# Patient Record
Sex: Female | Born: 1965 | Race: White | Hispanic: No | Smoking: Former smoker
Health system: Southern US, Community
[De-identification: ages and names within clinical notes are randomized; demographics above are authoritative.]

## PROBLEM LIST (undated history)

## (undated) DIAGNOSIS — T7840XA Allergy, unspecified, initial encounter: Secondary | ICD-10-CM

## (undated) DIAGNOSIS — D649 Anemia, unspecified: Secondary | ICD-10-CM

## (undated) DIAGNOSIS — E039 Hypothyroidism, unspecified: Secondary | ICD-10-CM

## (undated) DIAGNOSIS — E119 Type 2 diabetes mellitus without complications: Secondary | ICD-10-CM

## (undated) DIAGNOSIS — I10 Essential (primary) hypertension: Secondary | ICD-10-CM

## (undated) DIAGNOSIS — E786 Lipoprotein deficiency: Secondary | ICD-10-CM

## (undated) DIAGNOSIS — E669 Obesity, unspecified: Secondary | ICD-10-CM

## (undated) DIAGNOSIS — E66811 Obesity, class 1: Secondary | ICD-10-CM

## (undated) DIAGNOSIS — E079 Disorder of thyroid, unspecified: Secondary | ICD-10-CM

## (undated) HISTORY — DX: Obesity, unspecified: E66.9

## (undated) HISTORY — DX: Lipoprotein deficiency: E78.6

## (undated) HISTORY — PX: OTHER SURGICAL HISTORY: SHX169

## (undated) HISTORY — PX: CHOLECYSTECTOMY: SHX55

## (undated) HISTORY — DX: Disorder of thyroid, unspecified: E07.9

## (undated) HISTORY — DX: Obesity, class 1: E66.811

## (undated) HISTORY — PX: TUBAL LIGATION: SHX77

## (undated) HISTORY — DX: Allergy, unspecified, initial encounter: T78.40XA

## (undated) HISTORY — DX: Anemia, unspecified: D64.9

## (undated) HISTORY — DX: Essential (primary) hypertension: I10

## (undated) HISTORY — PX: TONSILLECTOMY: SUR1361

---

## 2010-11-17 ENCOUNTER — Encounter: Payer: Self-pay | Admitting: Obstetrics & Gynecology

## 2010-12-09 ENCOUNTER — Encounter: Payer: Self-pay | Admitting: Obstetrics & Gynecology

## 2010-12-16 ENCOUNTER — Other Ambulatory Visit: Payer: Self-pay | Admitting: Obstetrics & Gynecology

## 2010-12-16 ENCOUNTER — Encounter (INDEPENDENT_AMBULATORY_CARE_PROVIDER_SITE_OTHER): Payer: BC Managed Care – PPO | Admitting: Obstetrics & Gynecology

## 2010-12-16 DIAGNOSIS — Z01419 Encounter for gynecological examination (general) (routine) without abnormal findings: Secondary | ICD-10-CM

## 2010-12-16 DIAGNOSIS — Z1231 Encounter for screening mammogram for malignant neoplasm of breast: Secondary | ICD-10-CM

## 2010-12-16 DIAGNOSIS — N949 Unspecified condition associated with female genital organs and menstrual cycle: Secondary | ICD-10-CM

## 2010-12-16 DIAGNOSIS — Z113 Encounter for screening for infections with a predominantly sexual mode of transmission: Secondary | ICD-10-CM

## 2010-12-16 DIAGNOSIS — Z1272 Encounter for screening for malignant neoplasm of vagina: Secondary | ICD-10-CM

## 2010-12-16 DIAGNOSIS — N938 Other specified abnormal uterine and vaginal bleeding: Secondary | ICD-10-CM

## 2010-12-17 NOTE — Assessment & Plan Note (Signed)
NAME:  Alexa Murphy, Alexa Murphy                 ACCOUNT NO.:  [id number]  MEDICAL RECORD NO.:  [id number]           PATIENT TYPE:  LOCATION:  [facility]           FACILITY:  PHYSICIAN:  Allie Bossier, MD             DATE OF BIRTH:  DATE OF SERVICE:  12/16/2010                                 CLINIC NOTE  HISTORY OF PRESENT ILLNESS:  Alexa Murphy is a 45 year old divorced white G4, P2, A2 who comes in here for annual exam.  Her complaint is that of a 4-week history of right lower quadrant pain.  She also states that her periods have become irregular.  Her other issue is that of unprotected intercourse with two people, and she would like to be tested "for STDs."  PAST MEDICAL HISTORY:  Obesity and seasonal allergies.  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  She has been divorced 3 times, is currently sexually active with 2 friends "with benefits."  She has lost 25 pounds over the last 2 years intentionally.  She works at the night shift.  She has recently purchased a new house.  Periods are generally monthly and last 5-7 days but this past month she had an occasion of intermenstrual bleeding.  Her Pap smear, mammogram, and colonoscopy were all done in 2010.  She in 1999 was diagnosed with syphilis and at 45 years of age gonorrhea and in approximately 2003 Chlamydia and at some point she had some trichomoniasis.  PAST SURGICAL HISTORY:  C-section and tubal ligation.  FAMILY HISTORY:  No colon or breast cancer, but uterine cancer in her mother and her maternal grandmother.  SOCIAL HISTORY:  She drinks socially but denies tobacco or drug use.  ALLERGIES:  No latex allergies.  No known drug allergies.  MEDICATIONS:  Zyrtec p.r.n. and a multivitamin daily.  PHYSICAL EXAMINATION:  GENERAL/VITAL SIGNS:  Well-nourished, well- hydrated very pleasant white female, height 5 feet 6 inches, weight 196 pounds, blood pressure 120/66, and pulse 83. HEENT:   Normal. HEART:  Regular rhythm. LUNGS:  Clear to auscultation bilaterally.  Breast exam normal bilaterally. ABDOMEN:  Obese, benign.  No palpable hepatosplenomegaly.  Well-healed vertical C-section scar. EXTERNAL GENITALIA:  Shaved.  No lesions.  Vagina:  No lesions.  Normal discharge.  Cervix:  Parous, no lesions.  Uterus:  10-week size, lumpy consistent with fibroids, it is mobile.  She has a generous pelvis, right adnexa (where she has a pain), nontender and no masses.  The left adnexa does appear to have about a 4 cm mass but this could be related to a pedunculated fibroid as well.  ASSESSMENT AND PLAN.: 1. Annual exam.  I have checked Pap smear and recommended self-breast     and self-vulvar exams. 2. High-risk sexual behavior.  I have definitely recommended she use     condoms for prevention of STDs and I am checking her for GC,     Chlamydia, syphilis, hep C, hep B, HIV (she declines HSV-2 IgG). 3. Right lower quadrant pain and fibroid uterus on exam and question     of left adnexal mass.  I am  getting a GYN ultrasound scheduled, and     she will come back when those results are available this.  For general health maintenance, I am checking fasting lipids and TSH today, and fasting sugar.     Allie Bossier, MD    MCD/MEDQ  D:  12/16/2010  T:  12/17/2010  Job:  [id number]

## 2010-12-21 ENCOUNTER — Ambulatory Visit
Admission: RE | Admit: 2010-12-21 | Discharge: 2010-12-21 | Disposition: A | Discharge status: Home / Self Care | Payer: BC Managed Care – PPO | Source: Ambulatory Visit | Attending: Obstetrics & Gynecology | Admitting: Obstetrics & Gynecology

## 2010-12-21 ENCOUNTER — Ambulatory Visit
Admission: RE | Admit: 2010-12-21 | Discharge: 2010-12-21 | Disposition: A | Discharge status: Home / Self Care | Payer: BC Managed Care – PPO | Source: Ambulatory Visit

## 2010-12-21 DIAGNOSIS — N938 Other specified abnormal uterine and vaginal bleeding: Secondary | ICD-10-CM

## 2010-12-22 ENCOUNTER — Ambulatory Visit
Admission: RE | Admit: 2010-12-22 | Discharge: 2010-12-22 | Disposition: A | Discharge status: Home / Self Care | Payer: BC Managed Care – PPO | Source: Ambulatory Visit | Attending: Obstetrics & Gynecology | Admitting: Obstetrics & Gynecology

## 2010-12-22 DIAGNOSIS — Z1231 Encounter for screening mammogram for malignant neoplasm of breast: Secondary | ICD-10-CM

## 2010-12-23 ENCOUNTER — Ambulatory Visit: Payer: BC Managed Care – PPO

## 2010-12-23 ENCOUNTER — Other Ambulatory Visit: Payer: BC Managed Care – PPO

## 2010-12-30 ENCOUNTER — Ambulatory Visit: Payer: BC Managed Care – PPO | Admitting: Obstetrics & Gynecology

## 2010-12-30 DIAGNOSIS — R1031 Right lower quadrant pain: Secondary | ICD-10-CM

## 2010-12-30 DIAGNOSIS — N938 Other specified abnormal uterine and vaginal bleeding: Secondary | ICD-10-CM

## 2010-12-30 DIAGNOSIS — N949 Unspecified condition associated with female genital organs and menstrual cycle: Secondary | ICD-10-CM

## 2011-03-25 NOTE — Assessment & Plan Note (Signed)
NAME:  Alexa Murphy, Alexa Murphy                 ACCOUNT NO.:  [id number]  MEDICAL RECORD NO.:  [id number]           PATIENT TYPE:  LOCATION:  [facility] [place]           FACILITY:  PHYSICIAN:  Allie Bossier, MD             DATE OF BIRTH:  DATE OF SERVICE:  12/30/2010                                 CLINIC NOTE  Ms. Molinaro is a 45 year old divorced white G4, P2, A2 who was seen here for an annual exam on December 16, 2010.  At that time, she gave me a history of 4 weeks of right lower quadrant pain associated also with some irregular periods.  I did a full STD workup, these tests were all negative.  I did a Pap smear which was normal and I did a GYN ultrasound.  The uterus measured 12.9 cm with a posterior uterine body intramural fibroid measuring less than 2 cm and another one measuring less than 2 cm in the anterior uterine body.  I have explained that it is very unlikely that these 2 small fibroids were noted to be culprit for her pain.  I have recommended that she discontinue her Motrin, which she says does relieve the pain.  I also told her that to regulate her periods, I would recommend birth control pills (she is a nonsmoker). She agrees with this.  I have given her sample and a prescription for generic Ortho-Tri-Cyclen Lo.  She will start it today.  She will come back in a week for a blood pressure check.  I have told her that should her pain become worse or not get better over time, we would offer her a laparoscopy.     Allie Bossier, MD    MCD/MEDQ  D:  12/30/2010  T:  12/30/2010  Job:  [id number]

## 2012-03-06 ENCOUNTER — Ambulatory Visit (INDEPENDENT_AMBULATORY_CARE_PROVIDER_SITE_OTHER): Payer: BC Managed Care – PPO | Admitting: Physician Assistant

## 2012-03-06 ENCOUNTER — Other Ambulatory Visit: Payer: Self-pay | Admitting: Physician Assistant

## 2012-03-06 ENCOUNTER — Encounter: Payer: Self-pay | Admitting: Physician Assistant

## 2012-03-06 VITALS — BP 123/80 | HR 98 | Temp 99.1°F | Ht 66.0 in | Wt 187.0 lb

## 2012-03-06 DIAGNOSIS — E786 Lipoprotein deficiency: Secondary | ICD-10-CM

## 2012-03-06 DIAGNOSIS — J029 Acute pharyngitis, unspecified: Secondary | ICD-10-CM

## 2012-03-06 DIAGNOSIS — Z7251 High risk heterosexual behavior: Secondary | ICD-10-CM

## 2012-03-06 DIAGNOSIS — D649 Anemia, unspecified: Secondary | ICD-10-CM

## 2012-03-06 LAB — POCT URINALYSIS DIPSTICK
Bilirubin, UA: NEGATIVE
Blood, UA: NEGATIVE
Glucose, UA: NEGATIVE
Ketones, UA: NEGATIVE
Leukocytes, UA: NEGATIVE
Nitrite, UA: NEGATIVE

## 2012-03-06 LAB — POCT RAPID STREP A (OFFICE): Rapid Strep A Screen: NEGATIVE

## 2012-03-06 NOTE — Progress Notes (Signed)
  Subjective:    Patient ID: Alexa Murphy, female    DOB: 03-21-1966, 46 y.o.   MRN: [id number]  HPI Patient presents to the clinic as a new patient to establish care. PMH reviewed. Pt is on zyrtec for allergies and iron for anemia. She recently had blood work drawn at previous doctor and was told LDL was good but HDL was low and she was anemia.   She comes in today with sore throat that start yesterday.  She has not treated with anything. She still continues to eat and has no GI symptoms. She has ran a low grade fever for 2 days. She denies ear pain, SOB, cough, chills. She has had a mild headache. NO one around her has been sick. She was very busy this weekend. She went to carowinds and per pt had a lot of sex. She is also here to be tested for STD's. She has two sexual partners is not aware if how many partners they have. She did have a UTI a month ago. She denies painful urination today but she does state that she hurts vaginal. No abdomnial pain. Per patient she said that might be from the "rough sex" she had this weekend. She has had some clear vaginal discharge but nothing out of the ordinary for her.   Review of Systems     Objective:   Physical Exam  Constitutional: She is oriented to person, place, and time. She appears well-developed and well-nourished.  HENT:  Head: Normocephalic and atraumatic.  Right Ear: External ear normal.  Left Ear: External ear normal.  Nose: Nose normal.       TM's normal bilaterally. Negative to maxillary tenderness with palpation.  Oropharynx erythematous and 1-2 patches of exudate on pharynx right side/tonsils absent.  Eyes: Conjunctivae are normal.  Neck: Normal range of motion. Neck supple.       Bilateral anterior cervical enlarged lymhnodes and slightly tender to palpation.  Cardiovascular: Normal rate, regular rhythm, normal heart sounds and intact distal pulses.   Pulmonary/Chest: Effort normal and breath sounds normal. She has no wheezes.    Lymphadenopathy:    She has cervical adenopathy.  Neurological: She is alert and oriented to person, place, and time.  Skin: Skin is warm and dry.  Psychiatric: She has a normal mood and affect. Her behavior is normal.          Assessment & Plan:  Sore throat- Rapid strep negative. Will send throat culture off and call with results. Likely viral. Will treat symptomatically and gave pt handout. Mucinex-D twice a day. Drink lots of water. Tylenol help with sore throat. Gargling with salt water. If not improving in 48 hours call office.   High-risk sexual behavior- Will test for HIV, Sphyills, Hepatitis, GC and Chlamydia. UA-negative for blood, leuks, or nitrates. Needs pap smear yearly and due for now since high-risk behavior.   Low HDL- will recheck in 3-6 months with other blood work to make sure no other labs are abnormal.   Anemia- Will recheck in 3 months a CBC/iron panel to see if hgb is back up along with stores.

## 2012-03-06 NOTE — Patient Instructions (Addendum)
Mucinex-D twice a day. Drink lots of water. Tylenol help with sore throat. Gargling with salt water. If not improving in 48 hours call office.  Follow up in 3 months on anemia.  Will call with throat culture results.   Viral Pharyngitis Viral pharyngitis is a viral infection that produces redness, pain, and swelling (inflammation) of the throat. It can spread from person to person (contagious). CAUSES Viral pharyngitis is caused by inhaling a large amount of certain germs called viruses. Many different viruses cause viral pharyngitis. SYMPTOMS Symptoms of viral pharyngitis include:  Sore throat.   Tiredness.   Stuffy nose.   Low-grade fever.   Congestion.   Cough.  TREATMENT Treatment includes rest, drinking plenty of fluids, and the use of over-the-counter medication (approved by your caregiver). HOME CARE INSTRUCTIONS   Drink enough fluids to keep your urine clear or pale yellow.   Eat soft, cold foods such as ice cream, frozen ice pops, or gelatin dessert.   Gargle with warm salt water (1 tsp salt per 1 qt of water).   If over age 53, throat lozenges may be used safely.   Only take over-the-counter or prescription medicines for pain, discomfort, or fever as directed by your caregiver. Do not take aspirin.  To help prevent spreading viral pharyngitis to others, avoid:  Mouth-to-mouth contact with others.   Sharing utensils for eating and drinking.   Coughing around others.  SEEK MEDICAL CARE IF:   You are better in a few days, then become worse.   You have a fever or pain not helped by pain medicines.   There are any other changes that concern you.  Document Released: 06/09/2005 Document Revised: 08/19/2011 Document Reviewed: 11/05/2010 [facility] Patient Information 2012 [place], [place].

## 2012-03-07 LAB — RPR

## 2012-03-07 LAB — GC/CHLAMYDIA PROBE AMP, URINE
Chlamydia, Swab/Urine, PCR: NEGATIVE
GC Probe Amp, Urine: NEGATIVE

## 2012-03-07 LAB — HEPATITIS PANEL, ACUTE: Hepatitis B Surface Ag: NEGATIVE

## 2012-03-07 LAB — HIV ANTIBODY (ROUTINE TESTING W REFLEX): HIV: NONREACTIVE

## 2012-03-08 ENCOUNTER — Encounter: Payer: Self-pay | Admitting: Obstetrics & Gynecology

## 2012-03-08 ENCOUNTER — Encounter: Payer: Self-pay | Admitting: Physician Assistant

## 2012-03-08 ENCOUNTER — Ambulatory Visit (INDEPENDENT_AMBULATORY_CARE_PROVIDER_SITE_OTHER): Payer: BC Managed Care – PPO | Admitting: Obstetrics & Gynecology

## 2012-03-08 ENCOUNTER — Telehealth: Payer: Self-pay | Admitting: *Deleted

## 2012-03-08 VITALS — BP 116/74 | HR 87 | Temp 98.4°F | Resp 16 | Ht 65.0 in | Wt 188.0 lb

## 2012-03-08 DIAGNOSIS — Z1151 Encounter for screening for human papillomavirus (HPV): Secondary | ICD-10-CM

## 2012-03-08 DIAGNOSIS — E786 Lipoprotein deficiency: Secondary | ICD-10-CM | POA: Insufficient documentation

## 2012-03-08 DIAGNOSIS — Z124 Encounter for screening for malignant neoplasm of cervix: Secondary | ICD-10-CM

## 2012-03-08 DIAGNOSIS — J302 Other seasonal allergic rhinitis: Secondary | ICD-10-CM | POA: Insufficient documentation

## 2012-03-08 DIAGNOSIS — Z0000 Encounter for general adult medical examination without abnormal findings: Secondary | ICD-10-CM

## 2012-03-08 DIAGNOSIS — D649 Anemia, unspecified: Secondary | ICD-10-CM | POA: Insufficient documentation

## 2012-03-08 NOTE — Progress Notes (Signed)
Subjective:    Alexa Murphy is a 46 y.o. female who presents for an annual exam. The patient has no complaints today. Her pelvic pain from the spring resolvedThe patient is sexually active. GYN screening history: last pap: was normal. The patient wears seatbelts: yes. The patient participates in regular exercise: yes. Has the patient ever been transfused or tattooed?: yes. The patient reports that there is not domestic violence in her life.   Menstrual History: OB History    Grav Para Term Preterm Abortions TAB SAB Ect Mult Living                  Menarche age: 12 Patient's last menstrual period was 02/19/2012.    The following portions of the patient's history were reviewed and updated as appropriate: allergies, current medications, past family history, past medical history, past social history, past surgical history and problem list.  Review of Systems A comprehensive review of systems was negative. She lives alone. Her periods are normal. She recently had STI testing. She wants a thyroid panel drawn as she has an appt with a "thyroid doctor" in the near future.   Objective:    BP 116/74  Pulse 87  Temp 98.4 F (36.9 C) (Oral)  Resp 16  Ht 5\' 5"  (1.651 m)  Wt 188 lb (85.276 kg)  BMI 31.28 kg/m2  LMP 02/19/2012  General Appearance:    Alert, cooperative, no distress, appears stated age  Head:    Normocephalic, without obvious abnormality, atraumatic  Eyes:    PERRL, conjunctiva/corneas clear, EOM's intact, fundi    benign, both eyes  Ears:    Normal TM's and external ear canals, both ears  Nose:   Nares normal, septum midline, mucosa normal, no drainage    or sinus tenderness  Throat:   Lips, mucosa, and tongue normal; teeth and gums normal  Neck:   Supple, symmetrical, trachea midline, no adenopathy;    thyroid:  no enlargement/tenderness/nodules; no carotid   bruit or JVD  Back:     Symmetric, no curvature, ROM normal, no CVA tenderness  Lungs:     Clear to auscultation  bilaterally, respirations unlabored  Chest Wall:    No tenderness or deformity   Heart:    Regular rate and rhythm, S1 and S2 normal, no murmur, rub   or gallop  Breast Exam:    No tenderness, masses, or nipple abnormality  Abdomen:     Soft, non-tender, bowel sounds active all four quadrants,    no masses, no organomegaly  Genitalia:    Normal female without lesion, discharge or tenderness, shaved, no lesions, normal cervix, 10 week size mobile, NT uterus, no adnexal masses     Extremities:   Extremities normal, atraumatic, no cyanosis or edema  Pulses:   2+ and symmetric all extremities  Skin:   Skin color, texture, turgor normal, no rashes or lesions  Lymph nodes:   Cervical, supraclavicular, and axillary nodes normal  Neurologic:   CNII-XII intact, normal strength, sensation and reflexes    throughout  .    Assessment:    Healthy female exam.    Plan:     Mammogram. Pap smear.

## 2012-03-08 NOTE — Telephone Encounter (Signed)
Pt states that she is not feeling any better and states that now it hurts to swallow. States you told her to call today if not better. Please advise.

## 2012-03-08 NOTE — Telephone Encounter (Signed)
Let's wait until throat culture comes back then decide what next step is.

## 2012-03-09 LAB — CULTURE, GROUP A STREP

## 2012-03-09 LAB — T4, FREE: Free T4: 1.1 ng/dL (ref 0.80–1.80)

## 2012-03-09 LAB — T3, FREE: T3, Free: 3 pg/mL (ref 2.3–4.2)

## 2012-03-09 NOTE — Telephone Encounter (Signed)
Pt informed

## 2012-03-23 ENCOUNTER — Other Ambulatory Visit: Payer: Self-pay | Admitting: Obstetrics & Gynecology

## 2012-03-23 DIAGNOSIS — Z139 Encounter for screening, unspecified: Secondary | ICD-10-CM

## 2012-03-28 ENCOUNTER — Ambulatory Visit (INDEPENDENT_AMBULATORY_CARE_PROVIDER_SITE_OTHER): Payer: BC Managed Care – PPO

## 2012-03-28 DIAGNOSIS — Z0000 Encounter for general adult medical examination without abnormal findings: Secondary | ICD-10-CM

## 2012-03-28 DIAGNOSIS — Z1231 Encounter for screening mammogram for malignant neoplasm of breast: Secondary | ICD-10-CM

## 2013-05-28 ENCOUNTER — Other Ambulatory Visit: Payer: Self-pay | Admitting: Internal Medicine

## 2013-05-28 DIAGNOSIS — Z1231 Encounter for screening mammogram for malignant neoplasm of breast: Secondary | ICD-10-CM

## 2013-06-05 ENCOUNTER — Ambulatory Visit (INDEPENDENT_AMBULATORY_CARE_PROVIDER_SITE_OTHER): Payer: BC Managed Care – PPO

## 2013-06-05 ENCOUNTER — Ambulatory Visit: Payer: BC Managed Care – PPO

## 2013-06-05 DIAGNOSIS — R928 Other abnormal and inconclusive findings on diagnostic imaging of breast: Secondary | ICD-10-CM

## 2013-06-05 DIAGNOSIS — Z1231 Encounter for screening mammogram for malignant neoplasm of breast: Secondary | ICD-10-CM

## 2013-06-07 ENCOUNTER — Other Ambulatory Visit: Payer: Self-pay | Admitting: Internal Medicine

## 2013-06-07 DIAGNOSIS — R928 Other abnormal and inconclusive findings on diagnostic imaging of breast: Secondary | ICD-10-CM

## 2013-06-22 ENCOUNTER — Ambulatory Visit
Admission: RE | Admit: 2013-06-22 | Discharge: 2013-06-22 | Disposition: A | Discharge status: Home / Self Care | Payer: BC Managed Care – PPO | Source: Ambulatory Visit | Attending: Internal Medicine | Admitting: Internal Medicine

## 2013-06-22 DIAGNOSIS — R928 Other abnormal and inconclusive findings on diagnostic imaging of breast: Secondary | ICD-10-CM

## 2014-01-24 ENCOUNTER — Ambulatory Visit (INDEPENDENT_AMBULATORY_CARE_PROVIDER_SITE_OTHER): Payer: BC Managed Care – PPO | Admitting: Obstetrics & Gynecology

## 2014-01-24 ENCOUNTER — Encounter: Payer: Self-pay | Admitting: Obstetrics & Gynecology

## 2014-01-24 VITALS — BP 127/78 | HR 73 | Resp 16 | Ht 66.0 in | Wt 201.0 lb

## 2014-01-24 DIAGNOSIS — Z124 Encounter for screening for malignant neoplasm of cervix: Secondary | ICD-10-CM

## 2014-01-24 DIAGNOSIS — Z0000 Encounter for general adult medical examination without abnormal findings: Secondary | ICD-10-CM

## 2014-01-24 DIAGNOSIS — Z01419 Encounter for gynecological examination (general) (routine) without abnormal findings: Secondary | ICD-10-CM

## 2014-01-24 DIAGNOSIS — N920 Excessive and frequent menstruation with regular cycle: Secondary | ICD-10-CM

## 2014-01-24 DIAGNOSIS — Z1151 Encounter for screening for human papillomavirus (HPV): Secondary | ICD-10-CM

## 2014-01-24 NOTE — Progress Notes (Signed)
Subjective:    Alexa Murphy is a 48 y.o. female who presents for an annual exam. She says that her periods are very heavy and she is interested in the endometrial ablation. They are monthly, bleeds for 5 days, some cramping and fatigue. She was told that her hbg was 9 about 2 months ago by her endocrinologist and she was started on iron pills. She has to wear a pad and a tampon at the same time.The patient is sexually active. GYN screening history: last pap: was normal. The patient wears seatbelts: yes. The patient participates in regular exercise: yes. Has the patient ever been transfused or tattooed?: yes. The patient reports that there is not domestic violence in her life.   Menstrual History: OB History   Grav Para Term Preterm Abortions TAB SAB Ect Mult Living   4 3   1  1   2       Menarche age: 68  Patient's last menstrual period was 01/03/2014.    The following portions of the patient's history were reviewed and updated as appropriate: allergies, current medications, past family history, past medical history, past social history, past surgical history and problem list.  Review of Systems A comprehensive review of systems was negative. Her mammogram was done recently and was reportedly normal. She gets her TSH checked about every 3 months.   Objective:    BP 127/78  Pulse 73  Resp 16  Ht 5\' 6"  (1.676 m)  Wt 201 lb (91.173 kg)  BMI 32.46 kg/m2  LMP 01/03/2014  General Appearance:    Alert, cooperative, no distress, appears stated age  Head:    Normocephalic, without obvious abnormality, atraumatic  Eyes:    PERRL, conjunctiva/corneas clear, EOM's intact, fundi    benign, both eyes  Ears:    Normal TM's and external ear canals, both ears  Nose:   Nares normal, septum midline, mucosa normal, no drainage    or sinus tenderness  Throat:   Lips, mucosa, and tongue normal; teeth and gums normal  Neck:   Supple, symmetrical, trachea midline, no adenopathy;    thyroid:  no  enlargement/tenderness/nodules; no carotid   bruit or JVD  Back:     Symmetric, no curvature, ROM normal, no CVA tenderness  Lungs:     Clear to auscultation bilaterally, respirations unlabored  Chest Wall:    No tenderness or deformity   Heart:    Regular rate and rhythm, S1 and S2 normal, no murmur, rub   or gallop  Breast Exam:    No tenderness, masses, or nipple abnormality  Abdomen:     Soft, non-tender, bowel sounds active all four quadrants,    no masses, no organomegaly  Genitalia:    Normal female without lesion, discharge or tenderness, shaved, 10-12 week size, mobile, NT, normal adnexal exam     Extremities:   Extremities normal, atraumatic, no cyanosis or edema  Pulses:   2+ and symmetric all extremities  Skin:   Skin color, texture, turgor normal, no rashes or lesions  Lymph nodes:   Cervical, supraclavicular, and axillary nodes normal  Neurologic:   CNII-XII intact, normal strength, sensation and reflexes    throughout  .    Assessment:    Healthy female exam.  Menorrhagia   Plan:     Breast self exam technique reviewed and patient encouraged to perform self-exam monthly. Pelvic ultrasound. Thin prep Pap smear. with cotesting Cbc and TSH and gyn u/s

## 2014-01-25 ENCOUNTER — Ambulatory Visit (INDEPENDENT_AMBULATORY_CARE_PROVIDER_SITE_OTHER): Payer: BC Managed Care – PPO

## 2014-01-25 ENCOUNTER — Encounter: Payer: Self-pay | Admitting: *Deleted

## 2014-01-25 DIAGNOSIS — D252 Subserosal leiomyoma of uterus: Secondary | ICD-10-CM

## 2014-01-25 DIAGNOSIS — N920 Excessive and frequent menstruation with regular cycle: Secondary | ICD-10-CM

## 2014-01-25 LAB — CBC
HCT: 39.9 % (ref 36.0–46.0)
HEMOGLOBIN: 13.2 g/dL (ref 12.0–15.0)
MCH: 28.2 pg (ref 26.0–34.0)
MCHC: 33.1 g/dL (ref 30.0–36.0)
MCV: 85.3 fL (ref 78.0–100.0)
Platelets: 268 10*3/uL (ref 150–400)
RBC: 4.68 MIL/uL (ref 3.87–5.11)
RDW: 21 % — AB (ref 11.5–15.5)
WBC: 7.8 10*3/uL (ref 4.0–10.5)

## 2014-01-25 LAB — TSH: TSH: 2.35 u[IU]/mL (ref 0.350–4.500)

## 2014-01-25 NOTE — Telephone Encounter (Signed)
Erroneous encounter

## 2014-02-08 ENCOUNTER — Encounter: Payer: Self-pay | Admitting: Obstetrics & Gynecology

## 2014-02-08 ENCOUNTER — Ambulatory Visit (INDEPENDENT_AMBULATORY_CARE_PROVIDER_SITE_OTHER): Payer: BC Managed Care – PPO | Admitting: Obstetrics & Gynecology

## 2014-02-08 DIAGNOSIS — N920 Excessive and frequent menstruation with regular cycle: Secondary | ICD-10-CM

## 2014-02-08 MED ORDER — MISOPROSTOL 200 MCG PO TABS: ORAL_TABLET | ORAL | Status: DC

## 2014-02-08 NOTE — Progress Notes (Signed)
   Subjective:    Patient ID: Alexa Murphy, female    DOB: [birth year], 48 y.o.   MRN: [id number]  HPI  She is here to follow up on her labs and u/s. She is still interested in having the endometrial ablation. She had a BTL in the past.  Review of Systems     Objective:   Physical Exam        Assessment & Plan:  Menorrhagia with negative w/u (2 small fibroids)- plan for d&c and Novasure endometrial ablation.

## 2014-03-15 ENCOUNTER — Encounter (HOSPITAL_COMMUNITY): Payer: Self-pay | Admitting: Pharmacist

## 2014-03-21 ENCOUNTER — Encounter (HOSPITAL_COMMUNITY): Payer: Self-pay

## 2014-03-21 ENCOUNTER — Encounter (HOSPITAL_COMMUNITY)
Admission: RE | Admit: 2014-03-21 | Discharge: 2014-03-21 | Disposition: A | Discharge status: Home / Self Care | Payer: BC Managed Care – PPO | Source: Ambulatory Visit | Attending: Obstetrics & Gynecology | Admitting: Obstetrics & Gynecology

## 2014-03-21 DIAGNOSIS — Z01812 Encounter for preprocedural laboratory examination: Secondary | ICD-10-CM | POA: Insufficient documentation

## 2014-03-21 HISTORY — DX: Hypothyroidism, unspecified: E03.9

## 2014-03-21 HISTORY — DX: Type 2 diabetes mellitus without complications: E11.9

## 2014-03-21 LAB — BASIC METABOLIC PANEL
Anion gap: 9 (ref 5–15)
BUN: 11 mg/dL (ref 6–23)
CHLORIDE: 105 meq/L (ref 96–112)
CO2: 26 mEq/L (ref 19–32)
Calcium: 9.3 mg/dL (ref 8.4–10.5)
Creatinine, Ser: 0.94 mg/dL (ref 0.50–1.10)
GFR calc Af Amer: 81 mL/min — ABNORMAL LOW (ref >90)
GFR, EST NON AFRICAN AMERICAN: 70 mL/min — AB (ref >90)
Glucose, Bld: 93 mg/dL (ref 70–99)
Potassium: 4.4 mEq/L (ref 3.7–5.3)
Sodium: 140 mEq/L (ref 137–147)

## 2014-03-21 LAB — CBC
HCT: 39.8 % (ref 36.0–46.0)
Hemoglobin: 13.2 g/dL (ref 12.0–15.0)
MCH: 29.6 pg (ref 26.0–34.0)
MCHC: 33.2 g/dL (ref 30.0–36.0)
MCV: 89.2 fL (ref 78.0–100.0)
Platelets: 225 10*3/uL (ref 150–400)
RBC: 4.46 MIL/uL (ref 3.87–5.11)
RDW: 13.9 % (ref 11.5–15.5)
WBC: 6.2 10*3/uL (ref 4.0–10.5)

## 2014-03-21 NOTE — Patient Instructions (Signed)
[address]  03/21/2014   Your procedure is scheduled on:  03/27/14  Enter through the Main Entrance of [facility] at [place] up the phone at the desk and dial [phone].   Call this number if you have problems the morning of surgery: [phone]   Remember:   Do not eat food:After Midnight.  Do not drink clear liquids: After Midnight.  Take these medicines the morning of surgery with A SIP OF WATER: NA.  Hold Metformin 24hrs prior to surgery.   Do not wear jewelry, make-up or nail polish.  Do not wear lotions, powders, or perfumes. You may wear deodorant.  Do not shave 48 hours prior to surgery.  Do not bring valuables to the hospital.  [facility] is not   responsible for any belongings or valuables brought to the hospital.  Contacts, dentures or bridgework may not be worn into surgery.  Leave suitcase in the car. After surgery it may be brought to your room.  For patients admitted to the hospital, checkout time is 11:00 AM the day of              discharge.   Patients discharged the day of surgery will not be allowed to drive             home.  Name and phone number of your driver: NA  Special Instructions:      Please read over the following fact sheets that you were given:   Surgical Site Infection Prevention

## 2014-03-27 ENCOUNTER — Ambulatory Visit (HOSPITAL_COMMUNITY): Payer: BC Managed Care – PPO | Admitting: Anesthesiology

## 2014-03-27 ENCOUNTER — Encounter (HOSPITAL_COMMUNITY): Payer: Self-pay | Admitting: Anesthesiology

## 2014-03-27 ENCOUNTER — Ambulatory Visit (HOSPITAL_COMMUNITY)
Admission: RE | Admit: 2014-03-27 | Discharge: 2014-03-27 | Disposition: A | Discharge status: Home / Self Care | Payer: BC Managed Care – PPO | Source: Ambulatory Visit | Attending: Obstetrics & Gynecology | Admitting: Obstetrics & Gynecology

## 2014-03-27 ENCOUNTER — Encounter (HOSPITAL_COMMUNITY): Payer: BC Managed Care – PPO | Admitting: Anesthesiology

## 2014-03-27 ENCOUNTER — Encounter (HOSPITAL_COMMUNITY)
Admission: RE | Disposition: A | Discharge status: Home / Self Care | Payer: Self-pay | Source: Ambulatory Visit | Attending: Obstetrics & Gynecology

## 2014-03-27 DIAGNOSIS — E079 Disorder of thyroid, unspecified: Secondary | ICD-10-CM | POA: Insufficient documentation

## 2014-03-27 DIAGNOSIS — D649 Anemia, unspecified: Secondary | ICD-10-CM | POA: Insufficient documentation

## 2014-03-27 DIAGNOSIS — E039 Hypothyroidism, unspecified: Secondary | ICD-10-CM | POA: Insufficient documentation

## 2014-03-27 DIAGNOSIS — Z87891 Personal history of nicotine dependence: Secondary | ICD-10-CM | POA: Insufficient documentation

## 2014-03-27 DIAGNOSIS — E119 Type 2 diabetes mellitus without complications: Secondary | ICD-10-CM | POA: Insufficient documentation

## 2014-03-27 DIAGNOSIS — Z6830 Body mass index (BMI) 30.0-30.9, adult: Secondary | ICD-10-CM | POA: Insufficient documentation

## 2014-03-27 DIAGNOSIS — N920 Excessive and frequent menstruation with regular cycle: Secondary | ICD-10-CM | POA: Insufficient documentation

## 2014-03-27 HISTORY — PX: DILITATION & CURRETTAGE/HYSTROSCOPY WITH NOVASURE ABLATION: SHX5568

## 2014-03-27 LAB — PREGNANCY, URINE: Preg Test, Ur: NEGATIVE

## 2014-03-27 LAB — GLUCOSE, CAPILLARY: GLUCOSE-CAPILLARY: 87 mg/dL (ref 70–99)

## 2014-03-27 SURGERY — DILATATION & CURETTAGE/HYSTEROSCOPY WITH NOVASURE ABLATION
Anesthesia: General | Site: Uterus

## 2014-03-27 MED ORDER — ONDANSETRON HCL 4 MG/2ML IJ SOLN
INTRAMUSCULAR | Status: DC | PRN
  Administered 2014-03-27: 4 mg via INTRAVENOUS

## 2014-03-27 MED ORDER — FENTANYL CITRATE 0.05 MG/ML IJ SOLN
25.0000 ug | INTRAMUSCULAR | Status: DC | PRN
  Administered 2014-03-27 (×2): 50 ug via INTRAVENOUS

## 2014-03-27 MED ORDER — KETOROLAC TROMETHAMINE 30 MG/ML IJ SOLN
INTRAMUSCULAR | Status: AC
  Filled 2014-03-27: qty 1

## 2014-03-27 MED ORDER — DOXYCYCLINE HYCLATE 100 MG IV SOLR
100.0000 mg | Freq: Once | INTRAVENOUS | Status: AC
  Administered 2014-03-27: 100 mg via INTRAVENOUS
  Filled 2014-03-27: qty 100

## 2014-03-27 MED ORDER — FENTANYL CITRATE 0.05 MG/ML IJ SOLN
INTRAMUSCULAR | Status: DC | PRN
  Administered 2014-03-27: 100 ug via INTRAVENOUS

## 2014-03-27 MED ORDER — PROPOFOL 10 MG/ML IV EMUL
INTRAVENOUS | Status: AC
  Filled 2014-03-27: qty 20

## 2014-03-27 MED ORDER — HYDROCODONE-ACETAMINOPHEN 5-325 MG PO TABS: 1.0000 | ORAL_TABLET | Freq: Four times a day (QID) | ORAL | Status: DC | PRN

## 2014-03-27 MED ORDER — LACTATED RINGERS IV SOLN
Freq: Once | INTRAVENOUS | Status: AC
  Administered 2014-03-27: 10:00:00 via INTRAVENOUS

## 2014-03-27 MED ORDER — HYDROCODONE-ACETAMINOPHEN 5-325 MG PO TABS
ORAL_TABLET | ORAL | Status: AC
  Administered 2014-03-27: 1 via ORAL
  Filled 2014-03-27: qty 1

## 2014-03-27 MED ORDER — FENTANYL CITRATE 0.05 MG/ML IJ SOLN
INTRAMUSCULAR | Status: AC
  Administered 2014-03-27: 50 ug via INTRAVENOUS
  Filled 2014-03-27: qty 2

## 2014-03-27 MED ORDER — BUPIVACAINE HCL 0.5 % IJ SOLN
INTRAMUSCULAR | Status: DC | PRN
  Administered 2014-03-27: 30 mL

## 2014-03-27 MED ORDER — FENTANYL CITRATE 0.05 MG/ML IJ SOLN
INTRAMUSCULAR | Status: AC
  Filled 2014-03-27: qty 2

## 2014-03-27 MED ORDER — MIDAZOLAM HCL 2 MG/2ML IJ SOLN
INTRAMUSCULAR | Status: AC
  Filled 2014-03-27: qty 2

## 2014-03-27 MED ORDER — LIDOCAINE HCL (CARDIAC) 20 MG/ML IV SOLN
INTRAVENOUS | Status: AC
  Filled 2014-03-27: qty 5

## 2014-03-27 MED ORDER — GLYCOPYRROLATE 0.2 MG/ML IJ SOLN
INTRAMUSCULAR | Status: AC
  Filled 2014-03-27: qty 1

## 2014-03-27 MED ORDER — LACTATED RINGERS IV SOLN
INTRAVENOUS | Status: DC | PRN
  Administered 2014-03-27: 10:00:00 via INTRAVENOUS

## 2014-03-27 MED ORDER — ONDANSETRON HCL 4 MG/2ML IJ SOLN: INTRAMUSCULAR | Status: DC | PRN

## 2014-03-27 MED ORDER — LACTATED RINGERS IV SOLN
INTRAVENOUS | Status: DC
  Administered 2014-03-27: 11:00:00 via INTRAVENOUS

## 2014-03-27 MED ORDER — LIDOCAINE HCL (CARDIAC) 20 MG/ML IV SOLN
INTRAVENOUS | Status: DC | PRN
  Administered 2014-03-27: 50 mg via INTRAVENOUS

## 2014-03-27 MED ORDER — ONDANSETRON HCL 4 MG/2ML IJ SOLN
INTRAMUSCULAR | Status: AC
  Filled 2014-03-27: qty 2

## 2014-03-27 MED ORDER — MIDAZOLAM HCL 2 MG/2ML IJ SOLN
INTRAMUSCULAR | Status: DC | PRN
  Administered 2014-03-27: 2 mg via INTRAVENOUS

## 2014-03-27 MED ORDER — HYDROCODONE-ACETAMINOPHEN 5-325 MG PO TABS
1.0000 | ORAL_TABLET | Freq: Once | ORAL | Status: AC
  Administered 2014-03-27: 1 via ORAL

## 2014-03-27 MED ORDER — BUPIVACAINE HCL (PF) 0.5 % IJ SOLN
INTRAMUSCULAR | Status: AC
  Filled 2014-03-27: qty 30

## 2014-03-27 MED ORDER — PROPOFOL 10 MG/ML IV BOLUS
INTRAVENOUS | Status: DC | PRN
  Administered 2014-03-27: 200 mg via INTRAVENOUS

## 2014-03-27 SURGICAL SUPPLY — 14 items
ABLATOR ENDOMETRIAL BIPOLAR (ABLATOR) ×4 IMPLANT
CATH ROBINSON RED A/P 16FR (CATHETERS) ×2 IMPLANT
CLOTH BEACON ORANGE TIMEOUT ST (SAFETY) ×2 IMPLANT
DRAPE HYSTEROSCOPY (DRAPE) ×2 IMPLANT
GLOVE BIO SURGEON STRL SZ 6.5 (GLOVE) ×2 IMPLANT
GOWN STRL REUS W/TWL LRG LVL3 (GOWN DISPOSABLE) ×6 IMPLANT
NEEDLE SPNL 18GX3.5 QUINCKE PK (NEEDLE) ×2 IMPLANT
PACK VAGINAL MINOR WOMEN LF (CUSTOM PROCEDURE TRAY) ×2 IMPLANT
PAD OB MATERNITY 4.3X12.25 (PERSONAL CARE ITEMS) ×2 IMPLANT
SET TUBING HYSTEROSCOPY 2 NDL (TUBING) ×2 IMPLANT
SYR 30ML LL (SYRINGE) ×2 IMPLANT
TOWEL OR 17X24 6PK STRL BLUE (TOWEL DISPOSABLE) ×4 IMPLANT
TUBE HYSTEROSCOPY W Y-CONNECT (TUBING) ×2 IMPLANT
WATER STERILE IRR 1000ML POUR (IV SOLUTION) ×2 IMPLANT

## 2014-03-27 NOTE — Discharge Instructions (Signed)
Excuse from Work, [employer], or Physical Activity __________________________________________________ needs to be excused from: _____ Work _____ [employer] _____ Physical activity Beginning now and through the following date: ____________________ _____ He/she may return to work or school but still avoid physical activity from now until: ____________________ _____ He/she may return to full physical activity as of: ____________________ Caregiver's signature: ________________________________________  Date: ______________________________________________________ Document Released: 02/23/2001 Document Revised: 11/22/2011 Document Reviewed: 08/30/2005 [employer] Patient Information 2015 [place]. This information is not intended to replace advice given to you by your health care provider. Make sure you discuss any questions you have with your health care provider.     Dilation and Curettage or Vacuum Curettage, Care After Refer to this sheet in the next few weeks. These instructions provide you with information on caring for yourself after your procedure. Your health care provider may also give you more specific instructions. Your treatment has been planned according to current medical practices, but problems sometimes occur. Call your health care provider if you have any problems or questions after your procedure. WHAT TO EXPECT AFTER THE PROCEDURE After your procedure, it is typical to have light cramping and bleeding. This may last for 2 days to 2 weeks after the procedure. HOME CARE INSTRUCTIONS   Do not drive for 24 hours.  Wait 1 week before returning to strenuous activities.  Take your temperature 2 times a day for 4 days and write it down. Provide these temperatures to your health care provider if you develop a fever.  Avoid long periods of standing.  Avoid heavy lifting, pushing, or pulling. Do not lift anything heavier than 10 pounds (4.5 kg).  Limit stair climbing to once or twice a  day.  Take rest periods often.  You may resume your usual diet.  Drink enough fluids to keep your urine clear or pale yellow.  Your usual bowel function should return. If you have constipation, you may:  Take a mild laxative with permission from your health care provider.  Add fruit and bran to your diet.  Drink more fluids.  Take showers instead of baths until your health care provider gives you permission to take baths.  Do not go swimming or use a hot tub until your health care provider approves.  Try to have someone with you or available to you the first 24-48 hours, especially if you were given a general anesthetic.  Do not douche, use tampons, or have sex (intercourse) for 2 weeks after the procedure.  Only take over-the-counter or prescription medicines as directed by your health care provider. Do not take aspirin. It can cause bleeding.  Follow up with your health care provider as directed. SEEK MEDICAL CARE IF:   You have increasing cramps or pain that is not relieved with medicine.  You have abdominal pain that does not seem to be related to the same area of earlier cramping and pain.  You have bad smelling vaginal discharge.  You have a rash.  You are having problems with any medicine. SEEK IMMEDIATE MEDICAL CARE IF:   You have bleeding that is heavier than a normal menstrual period.  You have a fever.  You have chest pain.  You have shortness of breath.  You feel dizzy or feel like fainting.  You pass out.  You have pain in your shoulder strap area.  You have heavy vaginal bleeding with or without blood clots. MAKE SURE YOU:   Understand these instructions.  Will watch your condition.  Will get help right  away if you are not doing well or get worse. Document Released: 08/27/2000 Document Revised: 09/04/2013 Document Reviewed: 03/29/2013 [facility] Patient Information 2015 [place], [place]. This information is not intended to replace advice given  to you by your health care provider. Make sure you discuss any questions you have with your health care provider.

## 2014-03-27 NOTE — Op Note (Signed)
03/27/2014  11:14 AM  PATIENT:  Alexa Murphy  48 y.o. female  PRE-OPERATIVE DIAGNOSIS:   Menorrhagia    POST-OPERATIVE DIAGNOSIS:   Menorrhagia    PROCEDURE:  Procedure(s): DILATATION & CURETTAGE WITH NOVASURE ABLATION (N/A)  SURGEON:  Surgeon(s) and Role:    * Emily Filbert, MD - Primary  PHYSICIAN ASSISTANT:   ASSISTANTS: none   ANESTHESIA:   general and paracervical block  EBL:  Total I/O In: 1000 [I.V.:1000] Out: 100 [Urine:100]  BLOOD ADMINISTERED:none  DRAINS: none   LOCAL MEDICATIONS USED:  MARCAINE     SPECIMEN:  Source of Specimen:  uterine curettings  DISPOSITION OF SPECIMEN:  PATHOLOGY  COUNTS:  YES  TOURNIQUET:  * No tourniquets in log *  DICTATION: .Dragon Dictation  PLAN OF CARE: Discharge to home after PACU  PATIENT DISPOSITION:  PACU - hemodynamically stable.   Delay start of Pharmacological VTE agent (>24hrs) due to surgical blood loss or risk of bleeding: not applicable    The risks, benefits, and alternatives of surgery were explained, understood, and accepted. I quoted her a 90% satisfaction rate for the NovaSure endometrial ablation. All questions were answered. She was taken to the operating room and placed in the dorsal lithotomy position. General anesthesia was applied without complication. Her vagina was prepped and draped in the usual sterile fashion. Her bladder was emptied with a Robinson catheter. A bimanual exam revealed a normal size and shaped mobile uterus is anteverted. Her adnexa were non-enlarged. A w speculum was placed posteriorly and the anterior lip of her cervix was grasped with a single-tooth tenaculum. A paracervical block was performed using 30 mL of 0.5% Marcaine. Her uterus sounded to 12 cm. Her cervical length measured 4 cm. This gives her uterine cavity length of 8 cm. The cervix was gently dilated with Hegar dilators to accommodate the NovaSure device. Sharp curettage was done in all quadrants of the uterus as well as the  fundus. A Gritty sensation was appreciated.  The arms of the device was deployed and the uterine cavity width measured 4.5 cm. The device passed its test. An it ran for 75 seconds. I removed the tenaculum and no bleeding was noted. The NovaSure device was removed and no bleeding was noted from the endocervix. She was extubated and taken to the recovery room in stable condition. She tolerated the procedure well.

## 2014-03-27 NOTE — H&P (Signed)
Alexa Murphy is an 48 y.o. female G19P2 (42 and 48 yo) here today for endometrial ablation and d&c.   Pertinent Gynecological History: Menses: flow is excessive with use of 20 pads or tampons on heaviest days Bleeding: 5 days per month Contraception: none DES exposure: denies Blood transfusions: none Sexually transmitted diseases: no past history Previous GYN Procedures: DNC  Last mammogram: normal Date: 2015 Last pap: normal Date: 2015    Menstrual History: Menarche age: 48 No LMP recorded.    Past Medical History  Diagnosis Date  . Allergy   . Low HDL (under 40)   . Anemia   . Obesity (BMI 30.0-34.9)   . Thyroid disease   . Diabetes mellitus without complication     "pre-diabetic" seen by Dr Rosario Jacks every 94mos. [place]  . Hypothyroidism     Past Surgical History  Procedure Laterality Date  . Tubal ligation    . Cholecystectomy    . Cesarean section    . Right shoulder     . Tonsillectomy      Family History  Problem Relation Age of Onset  . Alcohol abuse Mother   . Cancer Mother     uterine  . Coronary artery disease Father   . Hyperthyroidism Maternal Grandmother   . Diabetes Maternal Grandfather     Social History:  reports that she has quit smoking. She does not have any smokeless tobacco history on file. She reports that she drinks alcohol. She reports that she does not use illicit drugs.  Allergies:  Allergies  Allergen Reactions  . Nsaids Rash    Prescriptions prior to admission  Medication Sig Dispense Refill  . cetirizine (ZYRTEC) 10 MG tablet Take 10 mg by mouth daily as needed for allergies.       . ferrous fumarate (HEMOCYTE - 106 MG FE) 325 (106 FE) MG TABS Take 1 tablet by mouth as needed. Take 1 tablet daily during menstral cycle      . MAGNESIUM CHLORIDE-CALCIUM PO Take 1 tablet by mouth at bedtime.      . metFORMIN (GLUCOPHAGE) 500 MG tablet 500 mg 2 (two) times daily with a meal.       . misoprostol (CYTOTEC) 200 MCG tablet Take 3  pills by mouth the night before procedure.  3 tablet  0  . SYNTHROID 50 MCG tablet 50 mcg daily before breakfast.       . valACYclovir (VALTREX) 1000 MG tablet Take 2,000 mg by mouth 2 (two) times daily as needed (takes 2 tabs twice daily for 2 days for fever blisters).         ROS  Blood pressure 119/73, pulse 65, temperature 97.9 F (36.6 C), temperature source Oral, resp. rate 16, SpO2 99.00%. Physical Exam Heart-rrr Lungs- CTAB Abd- benign  Results for orders placed during the hospital encounter of 03/27/14 (from the past 24 hour(s))  PREGNANCY, URINE     Status: None   Collection Time    03/27/14  8:20 AM      Result Value Ref Range   Preg Test, Ur NEGATIVE  NEGATIVE  GLUCOSE, CAPILLARY     Status: None   Collection Time    03/27/14  9:08 AM      Result Value Ref Range   Glucose-Capillary 87  70 - 99 mg/dL    No results found.  Assessment/Plan: Menorrhagia- plan for d&c and endometrial ablation.  She understands the risks of surgery, including, but not to infection, bleeding, DVTs, damage to  bowel, bladder, ureters. She wishes to proceed.     Kari Montero C. 03/27/2014, 9:54 AM

## 2014-03-27 NOTE — Anesthesia Postprocedure Evaluation (Signed)
  Anesthesia Post-op Note  Patient: Alexa Murphy  Procedure(s) Performed: Procedure(s): DILATATION & CURETTAGE WITH NOVASURE ABLATION (N/A) Patient is awake and responsive. Pain and nausea are reasonably well controlled. Vital signs are stable and clinically acceptable. Oxygen saturation is clinically acceptable. There are no apparent anesthetic complications at this time. Patient is ready for discharge.

## 2014-03-27 NOTE — Anesthesia Preprocedure Evaluation (Signed)
Anesthesia Evaluation  Patient identified by MRN, date of birth, ID band Patient awake    Reviewed: Allergy & Precautions, H&P , Patient's Chart, lab work & pertinent test results, reviewed documented beta blocker date and time   Airway Mallampati: II TM Distance: >3 FB Neck ROM: full    Dental no notable dental hx.    Pulmonary former smoker,  breath sounds clear to auscultation  Pulmonary exam normal       Cardiovascular Rhythm:regular Rate:Normal     Neuro/Psych    GI/Hepatic   Endo/Other  diabetes, Type 2Hypothyroidism   Renal/GU      Musculoskeletal   Abdominal   Peds  Hematology   Anesthesia Other Findings   Reproductive/Obstetrics                           Anesthesia Physical Anesthesia Plan  ASA: III  Anesthesia Plan:    Post-op Pain Management:    Induction: Intravenous  Airway Management Planned: LMA  Additional Equipment:   Intra-op Plan:   Post-operative Plan:   Informed Consent: I have reviewed the patients History and Physical, chart, labs and discussed the procedure including the risks, benefits and alternatives for the proposed anesthesia with the patient or authorized representative who has indicated his/her understanding and acceptance.   Dental Advisory Given and Dental advisory given  Plan Discussed with: CRNA and Surgeon  Anesthesia Plan Comments: (Discussed GA with LMA, possible sore throat, potential need to switch to ETT, N/V, pulmonary aspiration. Questions answered. )        Anesthesia Quick Evaluation

## 2014-03-27 NOTE — Transfer of Care (Signed)
Immediate Anesthesia Transfer of Care Note  Patient: Alexa Murphy  Procedure(s) Performed: Procedure(s): DILATATION & CURETTAGE WITH NOVASURE ABLATION (N/A)  Patient Location: PACU  Anesthesia Type:General  Level of Consciousness: awake, alert  and oriented  Airway & Oxygen Therapy: Patient Spontanous Breathing and Patient connected to nasal cannula oxygen  Post-op Assessment: Report given to PACU RN and Post -op Vital signs reviewed and stable  Post vital signs: Reviewed and stable  Complications: No apparent anesthesia complications

## 2014-03-28 ENCOUNTER — Encounter (HOSPITAL_COMMUNITY): Payer: Self-pay | Admitting: Obstetrics & Gynecology

## 2014-04-24 ENCOUNTER — Ambulatory Visit (INDEPENDENT_AMBULATORY_CARE_PROVIDER_SITE_OTHER): Payer: BC Managed Care – PPO | Admitting: Obstetrics & Gynecology

## 2014-04-24 ENCOUNTER — Encounter: Payer: Self-pay | Admitting: Obstetrics & Gynecology

## 2014-04-24 VITALS — BP 124/78 | HR 75 | Temp 97.3°F | Resp 16 | Ht 66.0 in | Wt 206.0 lb

## 2014-04-24 DIAGNOSIS — N898 Other specified noninflammatory disorders of vagina: Secondary | ICD-10-CM

## 2014-04-24 DIAGNOSIS — Z09 Encounter for follow-up examination after completed treatment for conditions other than malignant neoplasm: Secondary | ICD-10-CM

## 2014-04-24 LAB — WET PREP, GENITAL
Trich, Wet Prep: NONE SEEN
Yeast Wet Prep HPF POC: NONE SEEN

## 2014-04-24 MED ORDER — DOXYCYCLINE HYCLATE 100 MG PO CAPS
100.0000 mg | ORAL_CAPSULE | Freq: Two times a day (BID) | ORAL | Status: DC
Start: 2014-04-24 — End: 2014-08-14

## 2014-04-24 NOTE — Progress Notes (Signed)
   Subjective:    Patient ID: Alexa Murphy, female    DOB: [birth year], 48 y.o.   MRN: [id number]  HPI  Alexa Murphy is now 4 weeks post op s/p d&c and Novasure endometrial ablation. She has had a watery brown vaginal dischage the entire 4 weeks (now would be the typical time of her period). She developed a fever blister this past week. She has no other complaints.  Review of Systems     Objective:   Physical Exam  Watery brown discharge noted with speculum exam      Assessment & Plan:  As above Check wet prep Preemptively treat with doxy 100 mg BID for a week

## 2014-05-08 ENCOUNTER — Telehealth: Payer: Self-pay | Admitting: *Deleted

## 2014-05-08 NOTE — Telephone Encounter (Signed)
Pt called with c/o's of still having a brownish d/c from her ablation 5 weeks ago.  I explained that she may have d/c up to 6-8 weeks post surgery.  She will contact me in 2 weeks if still spotting and will get an U/S to check her endometrium.

## 2014-07-15 ENCOUNTER — Encounter: Payer: Self-pay | Admitting: Obstetrics & Gynecology

## 2014-08-14 ENCOUNTER — Encounter: Payer: Self-pay | Admitting: Obstetrics & Gynecology

## 2014-08-14 ENCOUNTER — Ambulatory Visit (INDEPENDENT_AMBULATORY_CARE_PROVIDER_SITE_OTHER): Payer: BC Managed Care – PPO | Admitting: Obstetrics & Gynecology

## 2014-08-14 ENCOUNTER — Other Ambulatory Visit: Payer: Self-pay | Admitting: Obstetrics & Gynecology

## 2014-08-14 VITALS — BP 123/79 | HR 84 | Resp 16 | Ht 66.0 in | Wt 208.0 lb

## 2014-08-14 DIAGNOSIS — Z113 Encounter for screening for infections with a predominantly sexual mode of transmission: Secondary | ICD-10-CM

## 2014-08-14 DIAGNOSIS — Z0000 Encounter for general adult medical examination without abnormal findings: Secondary | ICD-10-CM

## 2014-08-14 DIAGNOSIS — N6320 Unspecified lump in the left breast, unspecified quadrant: Secondary | ICD-10-CM

## 2014-08-14 DIAGNOSIS — Z09 Encounter for follow-up examination after completed treatment for conditions other than malignant neoplasm: Secondary | ICD-10-CM

## 2014-08-14 DIAGNOSIS — Z9889 Other specified postprocedural states: Secondary | ICD-10-CM | POA: Diagnosis not present

## 2014-08-14 DIAGNOSIS — Z4889 Encounter for other specified surgical aftercare: Secondary | ICD-10-CM

## 2014-08-14 DIAGNOSIS — Z1231 Encounter for screening mammogram for malignant neoplasm of breast: Secondary | ICD-10-CM

## 2014-08-14 NOTE — Progress Notes (Signed)
   Subjective:    Patient ID: Alexa Murphy, female    DOB: 06/22/1966, 48 y.o.   MRN: [id number]  HPI  48 yo lady here today for a 3 month post op visit. She had a Novasure endometrial ablation. She is still having periods but they now last only a day or 2.   Review of Systems     Objective:   Physical Exam        Assessment & Plan:  Post op doing well. RTC 1 year STI testing per her request

## 2014-08-15 LAB — GC/CHLAMYDIA PROBE AMP
CT PROBE, AMP APTIMA: NEGATIVE
GC Probe RNA: NEGATIVE

## 2014-08-15 LAB — HEPATITIS C ANTIBODY: HCV Ab: NEGATIVE

## 2014-08-15 LAB — HEPATITIS B SURFACE ANTIGEN: Hepatitis B Surface Ag: NEGATIVE

## 2014-08-15 LAB — HIV ANTIBODY (ROUTINE TESTING W REFLEX): HIV 1&2 Ab, 4th Generation: NONREACTIVE

## 2014-08-15 LAB — RPR

## 2014-08-21 ENCOUNTER — Ambulatory Visit: Payer: BC Managed Care – PPO

## 2014-09-04 ENCOUNTER — Ambulatory Visit (INDEPENDENT_AMBULATORY_CARE_PROVIDER_SITE_OTHER): Payer: BC Managed Care – PPO | Admitting: Obstetrics & Gynecology

## 2014-09-04 ENCOUNTER — Encounter: Payer: Self-pay | Admitting: Obstetrics & Gynecology

## 2014-09-04 VITALS — BP 120/83 | HR 77 | Resp 16 | Ht 65.0 in | Wt 208.0 lb

## 2014-09-04 DIAGNOSIS — L292 Pruritus vulvae: Secondary | ICD-10-CM

## 2014-09-04 DIAGNOSIS — N898 Other specified noninflammatory disorders of vagina: Secondary | ICD-10-CM

## 2014-09-04 MED ORDER — FLUCONAZOLE 150 MG PO TABS: 150.0000 mg | ORAL_TABLET | Freq: Once | ORAL | Status: DC

## 2014-09-04 NOTE — Progress Notes (Signed)
   Subjective:    Patient ID: Alexa Murphy, female    DOB: 1966-06-02, 48 y.o.   MRN: [id number]  HPI 48 yo lady here today to discuss her vaginal itching. This started 8 days ago. She tried brand Monistat 1 7 days ago. This helped the inside itching but the outside itching is not completely gone. Her main complaint is itching around her clitorus. In an attempt to impress a new partner with her cleanliness, she used harsh detergent and shaved for the first time in a long time. She had sex with new partner just before the itching started. She used a condom but it came off.    Review of Systems She had her tubes tied for contraception.    Objective:   Physical Exam        Assessment & Plan:

## 2014-09-05 ENCOUNTER — Ambulatory Visit
Admission: RE | Admit: 2014-09-05 | Discharge: 2014-09-05 | Disposition: A | Discharge status: Home / Self Care | Payer: BC Managed Care – PPO | Source: Ambulatory Visit | Attending: Obstetrics & Gynecology | Admitting: Obstetrics & Gynecology

## 2014-09-05 DIAGNOSIS — N6320 Unspecified lump in the left breast, unspecified quadrant: Secondary | ICD-10-CM

## 2014-09-05 LAB — WET PREP FOR TRICH, YEAST, CLUE
CLUE CELLS WET PREP: NONE SEEN
TRICH WET PREP: NONE SEEN
Yeast Wet Prep HPF POC: NONE SEEN

## 2014-09-20 ENCOUNTER — Other Ambulatory Visit: Payer: Self-pay | Admitting: *Deleted

## 2014-09-20 DIAGNOSIS — B373 Candidiasis of vulva and vagina: Secondary | ICD-10-CM

## 2014-09-20 DIAGNOSIS — B3731 Acute candidiasis of vulva and vagina: Secondary | ICD-10-CM

## 2014-09-20 MED ORDER — FLUCONAZOLE 150 MG PO TABS: 150.0000 mg | ORAL_TABLET | Freq: Once | ORAL | Status: DC

## 2014-09-20 NOTE — Telephone Encounter (Signed)
Pt called stating that she is conituing to have vaginal itching and request RF on Diflucan.  This was sent to [facility]

## 2014-11-19 ENCOUNTER — Ambulatory Visit (INDEPENDENT_AMBULATORY_CARE_PROVIDER_SITE_OTHER): Payer: BLUE CROSS/BLUE SHIELD | Admitting: Obstetrics & Gynecology

## 2014-11-19 ENCOUNTER — Encounter: Payer: Self-pay | Admitting: Obstetrics & Gynecology

## 2014-11-19 VITALS — BP 116/73 | HR 70 | Temp 97.4°F | Resp 16 | Ht 65.0 in | Wt 194.0 lb

## 2014-11-19 DIAGNOSIS — N390 Urinary tract infection, site not specified: Secondary | ICD-10-CM | POA: Diagnosis not present

## 2014-11-19 DIAGNOSIS — R309 Painful micturition, unspecified: Secondary | ICD-10-CM

## 2014-11-19 LAB — POCT URINALYSIS DIPSTICK
BILIRUBIN UA: NEGATIVE
Glucose, UA: NEGATIVE
KETONES UA: NEGATIVE
Protein, UA: NEGATIVE
Spec Grav, UA: 1.015
Urobilinogen, UA: NEGATIVE
pH, UA: 5

## 2014-11-19 MED ORDER — CIPROFLOXACIN HCL 500 MG PO TABS: 500.0000 mg | ORAL_TABLET | Freq: Two times a day (BID) | ORAL | Status: DC

## 2014-11-19 MED ORDER — PHENAZOPYRIDINE HCL 200 MG PO TABS: 200.0000 mg | ORAL_TABLET | Freq: Three times a day (TID) | ORAL | Status: DC | PRN

## 2014-11-19 NOTE — Progress Notes (Signed)
   CLINIC ENCOUNTER NOTE  History:  49 y.o. [id number] here today for dysuria for the last few days and suprapubic pain. No fevers.    The following portions of the patient's history were reviewed and updated as appropriate: allergies, current medications, past family history, past medical history, past social history, past surgical history and problem list. Normal pap and negative HRHPV on 01/24/14.  Normal mammogram on 09/05/14.   Review of Systems:  Pertinent items are noted in HPI.  Objective:  Physical Exam BP 116/73 mmHg  Pulse 70  Temp(Src) 97.4 F (36.3 C) (Oral)  Resp 16  Ht 5\' 5"  (1.651 m)  Wt 194 lb (87.998 kg)  BMI 32.28 kg/m2  LMP 11/16/2014 Gen: NAD Abd: Soft, mild suprapubic tenderness and nondistended Pelvic: Normal appearing external genitalia; no lesions seen.  Labs and Imaging Results for orders placed or performed in visit on 11/19/14 (from the past 24 hour(s))  POCT Urinalysis Dipstick     Status: Abnormal   Collection Time: 11/19/14  1:37 PM  Result Value Ref Range   Color, UA yellow    Clarity, UA cloudy    Glucose, UA neg    Bilirubin, UA neg    Ketones, UA neg    Spec Grav, UA 1.015    Blood, UA large    pH, UA 5.0    Protein, UA neg    Urobilinogen, UA negative    Nitrite, UA postive    Leukocytes, UA small (1+)    Urine culture sent   Assessment & Plan:  Patient has a UTI; urine culture pending Prescribed Ciprofloxacin 500 mg po bid x 3 days and Pyridium as needed; will follow up culture sensitivities Routine preventative health maintenance measures emphasized.   Verita Schneiders, MD, [place] Attending [place] for [employer], [place]

## 2014-11-19 NOTE — Patient Instructions (Signed)

## 2014-11-22 LAB — CULTURE, URINE COMPREHENSIVE: Colony Count: >=100000

## 2015-05-08 ENCOUNTER — Encounter: Payer: Self-pay | Admitting: Obstetrics & Gynecology

## 2015-05-08 ENCOUNTER — Ambulatory Visit (INDEPENDENT_AMBULATORY_CARE_PROVIDER_SITE_OTHER): Payer: BLUE CROSS/BLUE SHIELD | Admitting: Obstetrics & Gynecology

## 2015-05-08 VITALS — BP 117/69 | HR 65 | Resp 16 | Ht 65.0 in | Wt 213.0 lb

## 2015-05-08 DIAGNOSIS — Z202 Contact with and (suspected) exposure to infections with a predominantly sexual mode of transmission: Secondary | ICD-10-CM | POA: Diagnosis not present

## 2015-05-08 DIAGNOSIS — Z113 Encounter for screening for infections with a predominantly sexual mode of transmission: Secondary | ICD-10-CM

## 2015-05-08 NOTE — Progress Notes (Signed)
Pt here for STD testing.  Has had the same partner for 9 months but recently has broke up.  Pt c/o fishy dead odor.  Last pap smear 2023/02/13 and normal.  Pt gets FLU vaccine @ work.  Pt denies pelvic pain.    ROS: General: Negative for fevers or fatigue Pulmonary: No SOB  Cardiovascular: No chest pain GI: No abdominal pain OB/GYN: Positive fishy odor as described above Musculoskeletal: No back pain or muscle pain  Gen.: Well-nourished well-developed no apparent distress Pulmonary: Normal effort abdominal: Soft, nontender, no rebound, no guarding Vulva: Tanner 5 no lesion Vagina: Pink, rugate, thin white discharge will slightly frothy Cervix: No CMT no lesion Uterus: Nontender normal size Adnexa: No masses nontender  Assessment and plan  49 year old female for STD check after suspicion that her significant other was cheating on her. Full STD panel Also check for vaginitis Patient educated on signs of herpes and warts Return to clinic when necessary Call with results.

## 2015-05-09 ENCOUNTER — Telehealth: Payer: Self-pay | Admitting: *Deleted

## 2015-05-09 DIAGNOSIS — B9689 Other specified bacterial agents as the cause of diseases classified elsewhere: Secondary | ICD-10-CM

## 2015-05-09 DIAGNOSIS — N760 Acute vaginitis: Principal | ICD-10-CM

## 2015-05-09 LAB — HEPATITIS B SURFACE ANTIGEN: Hepatitis B Surface Ag: NEGATIVE

## 2015-05-09 LAB — GC/CHLAMYDIA PROBE AMP
CT PROBE, AMP APTIMA: NEGATIVE
GC Probe RNA: NEGATIVE

## 2015-05-09 LAB — WET PREP BY MOLECULAR PROBE
CANDIDA SPECIES: NEGATIVE
GARDNERELLA VAGINALIS: POSITIVE — AB
TRICHOMONAS VAG: NEGATIVE

## 2015-05-09 LAB — HEPATITIS C ANTIBODY: HCV Ab: NEGATIVE

## 2015-05-09 LAB — RPR

## 2015-05-09 LAB — HIV ANTIBODY (ROUTINE TESTING W REFLEX): HIV 1&2 Ab, 4th Generation: NONREACTIVE

## 2015-05-09 MED ORDER — METRONIDAZOLE 500 MG PO TABS: 500.0000 mg | ORAL_TABLET | Freq: Two times a day (BID) | ORAL | Status: DC

## 2015-05-09 NOTE — Telephone Encounter (Signed)
Pt notified of neg STD testing but does have BV and RX sent to pharmacy per protocol

## 2015-06-02 ENCOUNTER — Ambulatory Visit (INDEPENDENT_AMBULATORY_CARE_PROVIDER_SITE_OTHER): Payer: BLUE CROSS/BLUE SHIELD | Admitting: Obstetrics and Gynecology

## 2015-06-02 ENCOUNTER — Encounter: Payer: Self-pay | Admitting: Obstetrics and Gynecology

## 2015-06-02 VITALS — BP 107/73 | HR 71 | Resp 16 | Ht 65.0 in | Wt 209.0 lb

## 2015-06-02 DIAGNOSIS — N760 Acute vaginitis: Secondary | ICD-10-CM | POA: Diagnosis not present

## 2015-06-02 NOTE — Progress Notes (Signed)
   Subjective:    Patient ID: Alexa Murphy, female    DOB: [birth year], 49 y.o.   MRN: [id number]  HPI  49 yo presenting today for the evaluation of vaginitis. Patient reports the onset of a vaginal discharge for the past 4 days. Patient reports the discharge is white with a fishy odor. She has recently reunited with her former partner and has had unprotected intercourse. She is not interested in STD testing today. She denies any change in soap, laundry detergent. She doesn't use pads or condoms  Past Medical History  Diagnosis Date  . Allergy   . Low HDL (under 40)   . Anemia   . Obesity (BMI 30.0-34.9)   . Thyroid disease   . Diabetes mellitus without complication     "pre-diabetic" seen by Dr Rosario Jacks every 61mos. [place]  . Hypothyroidism    Past Surgical History  Procedure Laterality Date  . Tubal ligation    . Cholecystectomy    . Cesarean section    . Right shoulder     . Tonsillectomy    . Dilitation & currettage/hystroscopy with novasure ablation N/A 03/27/2014    Procedure: DILATATION & CURETTAGE WITH NOVASURE ABLATION;  Surgeon: Emily Filbert, MD;  Location: [facility];  Service: Gynecology;  Laterality: N/A;   Family History  Problem Relation Age of Onset  . Alcohol abuse Mother   . Cancer Mother     uterine  . Coronary artery disease Father   . Hyperthyroidism Maternal Grandmother   . Diabetes Maternal Grandfather    Social History  Substance Use Topics  . Smoking status: Former Research scientist (life sciences)  . Smokeless tobacco: None  . Alcohol Use: Yes     Comment: rarely     Review of Systems See pertinent in HPI    Objective:   Physical Exam GENERAL: Well-developed, well-nourished female in no acute distress.  ABDOMEN: Soft, nontender, nondistended. No organomegaly. PELVIC: Normal external female genitalia. Vagina is pink and rugated.  Normal discharge. Normal appearing cervix. Uterus is normal in size. No adnexal mass or tenderness. EXTREMITIES: No cyanosis, clubbing, or  edema, 2+ distal pulses.     Assessment & Plan:  49 yo with vaginitis - wet prep collected - Patient will be contacted with any abnormal results - RTC prn

## 2015-06-03 ENCOUNTER — Telehealth: Payer: Self-pay | Admitting: *Deleted

## 2015-06-03 DIAGNOSIS — B9689 Other specified bacterial agents as the cause of diseases classified elsewhere: Secondary | ICD-10-CM

## 2015-06-03 DIAGNOSIS — N760 Acute vaginitis: Principal | ICD-10-CM

## 2015-06-03 LAB — WET PREP, GENITAL
Trich, Wet Prep: NONE SEEN
WBC, Wet Prep HPF POC: NONE SEEN
Yeast Wet Prep HPF POC: NONE SEEN

## 2015-06-03 MED ORDER — METRONIDAZOLE 500 MG PO TABS: 500.0000 mg | ORAL_TABLET | Freq: Two times a day (BID) | ORAL | Status: DC

## 2015-06-03 NOTE — Telephone Encounter (Signed)
Pt notified of wet prep results and since pt did come in symptomatic for BV and test should only a few a RX for Flagyl was sent to her pharmacy.  Pt encouraged to start a probiotic daily.

## 2015-06-04 MED ORDER — METRONIDAZOLE 500 MG PO TABS: 500.0000 mg | ORAL_TABLET | Freq: Two times a day (BID) | ORAL | Status: AC

## 2015-06-04 NOTE — Addendum Note (Signed)
Addended by: Mora Bellman on: 06/04/2015 09:51 AM   Modules accepted: Orders

## 2015-11-11 ENCOUNTER — Ambulatory Visit (INDEPENDENT_AMBULATORY_CARE_PROVIDER_SITE_OTHER): Payer: BLUE CROSS/BLUE SHIELD | Admitting: Obstetrics & Gynecology

## 2015-11-11 ENCOUNTER — Encounter: Payer: Self-pay | Admitting: Obstetrics & Gynecology

## 2015-11-11 VITALS — BP 121/75 | HR 71 | Resp 16 | Ht 65.0 in | Wt 212.0 lb

## 2015-11-11 DIAGNOSIS — Z30430 Encounter for insertion of intrauterine contraceptive device: Secondary | ICD-10-CM

## 2015-11-11 DIAGNOSIS — N898 Other specified noninflammatory disorders of vagina: Secondary | ICD-10-CM

## 2015-11-11 DIAGNOSIS — L298 Other pruritus: Secondary | ICD-10-CM | POA: Diagnosis not present

## 2015-11-11 DIAGNOSIS — N9489 Other specified conditions associated with female genital organs and menstrual cycle: Secondary | ICD-10-CM

## 2015-11-11 DIAGNOSIS — Z0000 Encounter for general adult medical examination without abnormal findings: Secondary | ICD-10-CM

## 2015-11-11 MED ORDER — HYLAFEM VA SUPP: 1.0000 | Freq: Every day | VAGINAL | Status: AC

## 2015-11-11 MED ORDER — LEVONORGESTREL 13.5 MG IU IUD
1.0000 | INTRAUTERINE_SYSTEM | Freq: Once | INTRAUTERINE | Status: AC
  Administered 2015-11-11: 1 via INTRAUTERINE

## 2015-11-11 MED ORDER — METRONIDAZOLE 500 MG PO TABS: 500.0000 mg | ORAL_TABLET | Freq: Two times a day (BID) | ORAL | Status: DC

## 2015-11-11 NOTE — Progress Notes (Signed)
   Subjective:    Patient ID: Alexa Murphy, female    DOB: 11-26-1965, 50 y.o.   MRN: [id number]  HPI  50 yo SW lady is here for unpleasant vaginal odor and some external itching. She has had several cases of BV, treated with flagyl.   Review of Systems She has had no periods for several months (had a Novasure last year)    Objective:   Physical Exam [id number] Breathing, conversing, and ambulating normally Abd- benign Vulva- shaved, no lesions Vagina- white discharge c/w BV       Assessment & Plan:  Preventative care- mammogram, refer to GI for colon cancer screening Recurrent BV- flagyl with refills and prescribe hylafem

## 2016-03-02 ENCOUNTER — Ambulatory Visit (INDEPENDENT_AMBULATORY_CARE_PROVIDER_SITE_OTHER): Payer: BLUE CROSS/BLUE SHIELD | Admitting: Obstetrics & Gynecology

## 2016-03-02 ENCOUNTER — Encounter: Payer: Self-pay | Admitting: Obstetrics & Gynecology

## 2016-03-02 VITALS — BP 126/78 | HR 89 | Resp 16 | Ht 65.0 in | Wt 223.0 lb

## 2016-03-02 DIAGNOSIS — N898 Other specified noninflammatory disorders of vagina: Secondary | ICD-10-CM

## 2016-03-02 DIAGNOSIS — Z113 Encounter for screening for infections with a predominantly sexual mode of transmission: Secondary | ICD-10-CM

## 2016-03-02 DIAGNOSIS — N941 Unspecified dyspareunia: Secondary | ICD-10-CM

## 2016-03-02 DIAGNOSIS — Z202 Contact with and (suspected) exposure to infections with a predominantly sexual mode of transmission: Secondary | ICD-10-CM

## 2016-03-02 NOTE — Progress Notes (Signed)
   Subjective:    Patient ID: Alexa Murphy, female    DOB: 09/05/1966, 50 y.o.   MRN: [id number]  HPI 50 yo SW lady here for 3 reasons: 1) She is concerned about the fidelity of her boyfriend and would like STI testing. 2) She is concerned about a vaginal discharge, doesn't think that it is her usual BV 3) She is feeling like her vaginal opening is getting some abrasions with sex, for the last 3-4 months.   Review of Systems She had an ablation in 2015, has only had 1 period since (last month after changing her synthroid formulation). She had a BTL in the past.    Objective:   Physical Exam WNWHWFNAD Breathing, conversing, and ambulating normally Abd- benign EG- no lesions, no significant atrophy Vaginal vault- scarce discharge, what is there is thin, white, no odor       Assessment & Plan:  Concern about STI- STI testing Concern about vulvar irritation with sex- check [facility] Concern about discharge- wet prep sent

## 2016-03-03 ENCOUNTER — Ambulatory Visit (INDEPENDENT_AMBULATORY_CARE_PROVIDER_SITE_OTHER): Payer: BLUE CROSS/BLUE SHIELD

## 2016-03-03 ENCOUNTER — Telehealth: Payer: Self-pay | Admitting: *Deleted

## 2016-03-03 DIAGNOSIS — Z0000 Encounter for general adult medical examination without abnormal findings: Secondary | ICD-10-CM

## 2016-03-03 DIAGNOSIS — N760 Acute vaginitis: Secondary | ICD-10-CM

## 2016-03-03 DIAGNOSIS — B379 Candidiasis, unspecified: Secondary | ICD-10-CM

## 2016-03-03 DIAGNOSIS — Z1231 Encounter for screening mammogram for malignant neoplasm of breast: Secondary | ICD-10-CM | POA: Diagnosis not present

## 2016-03-03 DIAGNOSIS — B9689 Other specified bacterial agents as the cause of diseases classified elsewhere: Secondary | ICD-10-CM

## 2016-03-03 LAB — HIV ANTIBODY (ROUTINE TESTING W REFLEX): HIV: NONREACTIVE

## 2016-03-03 LAB — WET PREP FOR TRICH, YEAST, CLUE: Trich, Wet Prep: NONE SEEN

## 2016-03-03 LAB — FOLLICLE STIMULATING HORMONE: FSH: 32.7 m[IU]/mL

## 2016-03-03 LAB — HEPATITIS C ANTIBODY: HCV Ab: NEGATIVE

## 2016-03-03 LAB — RPR

## 2016-03-03 LAB — HEPATITIS B SURFACE ANTIGEN: Hepatitis B Surface Ag: NEGATIVE

## 2016-03-03 MED ORDER — FLUCONAZOLE 150 MG PO TABS: ORAL_TABLET | ORAL | Status: AC

## 2016-03-03 MED ORDER — METRONIDAZOLE 500 MG PO TABS: 500.0000 mg | ORAL_TABLET | Freq: Two times a day (BID) | ORAL | Status: AC

## 2016-03-03 NOTE — Telephone Encounter (Signed)
Pt notified of normal blood work.  She does have BV and yeast and appropriate meds were sent to her pharmacy per Dr Hulan Fray.

## 2016-03-04 LAB — URINE CYTOLOGY ANCILLARY ONLY
CHLAMYDIA, DNA PROBE: NEGATIVE
NEISSERIA GONORRHEA: NEGATIVE

## 2017-07-04 ENCOUNTER — Ambulatory Visit (INDEPENDENT_AMBULATORY_CARE_PROVIDER_SITE_OTHER): Payer: BLUE CROSS/BLUE SHIELD | Admitting: Obstetrics and Gynecology

## 2017-07-04 ENCOUNTER — Encounter: Payer: Self-pay | Admitting: Obstetrics and Gynecology

## 2017-07-04 VITALS — BP 134/84 | HR 86 | Ht 66.0 in | Wt 206.0 lb

## 2017-07-04 DIAGNOSIS — Z1151 Encounter for screening for human papillomavirus (HPV): Secondary | ICD-10-CM | POA: Diagnosis not present

## 2017-07-04 DIAGNOSIS — Z202 Contact with and (suspected) exposure to infections with a predominantly sexual mode of transmission: Secondary | ICD-10-CM | POA: Diagnosis not present

## 2017-07-04 DIAGNOSIS — Z113 Encounter for screening for infections with a predominantly sexual mode of transmission: Secondary | ICD-10-CM

## 2017-07-04 DIAGNOSIS — Z01419 Encounter for gynecological examination (general) (routine) without abnormal findings: Secondary | ICD-10-CM

## 2017-07-04 DIAGNOSIS — Z124 Encounter for screening for malignant neoplasm of cervix: Secondary | ICD-10-CM

## 2017-07-04 MED ORDER — VALACYCLOVIR HCL 1 G PO TABS: 2000.0000 mg | ORAL_TABLET | Freq: Two times a day (BID) | ORAL | 6 refills | Status: DC | PRN

## 2017-07-04 NOTE — Progress Notes (Signed)
Subjective:     Alexa Murphy is a 51 y.o. female with BMI 33, s/p endometrial ablation who is here for a comprehensive physical exam. The patient reports no problems. She is sexually active without complaints. She has had a BTL. Patient denies any vaginal bleeding or abnormal discharge. She denies any urinary incontinence. She denies any pelvic or abdominal pain. Patient denies any vasomotor symptoms  Past Medical History:  Diagnosis Date  . Allergy   . Anemia   . Diabetes mellitus without complication ([place])    "pre-diabetic" seen by Dr Rosario Jacks every 46mos. [place]  . Hypothyroidism   . Low HDL (under 40)   . Obesity (BMI 30.0-34.9)   . Thyroid disease    Past Surgical History:  Procedure Laterality Date  . CESAREAN SECTION    . CHOLECYSTECTOMY    . DILITATION & CURRETTAGE/HYSTROSCOPY WITH NOVASURE ABLATION N/A 03/27/2014   Procedure: DILATATION & CURETTAGE WITH NOVASURE ABLATION;  Surgeon: Emily Filbert, MD;  Location: [facility];  Service: Gynecology;  Laterality: N/A;  . right shoulder     . TONSILLECTOMY    . TUBAL LIGATION     Family History  Problem Relation Age of Onset  . Alcohol abuse Mother   . Cancer Mother        uterine  . Coronary artery disease Father   . Hyperthyroidism Maternal Grandmother   . Diabetes Maternal Grandfather      Social History   Social History  . Marital status: Divorced    Spouse name: N/A  . Number of children: N/A  . Years of education: N/A   Occupational History  . Not on file.   Social History Main Topics  . Smoking status: Former Research scientist (life sciences)  . Smokeless tobacco: Never Used  . Alcohol use Yes     Comment: rarely  . Drug use: No  . Sexual activity: Yes    Partners: Male    Birth control/ protection: None, Other-see comments     Comment: ablation   Other Topics Concern  . Not on file   Social History Narrative  . No narrative on file   Health Maintenance  Topic Date Due  . TETANUS/TDAP  01/28/1984  . COLONOSCOPY   01/28/2015  . PAP SMEAR  01/24/2017  . INFLUENZA VACCINE  04/13/2017  . MAMMOGRAM  03/03/2018  . HIV Screening  Completed       Review of Systems Pertinent items are noted in HPI.   Objective:  Blood pressure 134/84, pulse 86, height 5\' 6"  (1.676 m), weight 206 lb (93.4 kg).     GENERAL: Well-developed, well-nourished female in no acute distress.  HEENT: Normocephalic, atraumatic. Sclerae anicteric.  NECK: Supple. Normal thyroid.  LUNGS: Clear to auscultation bilaterally.  HEART: Regular rate and rhythm. BREASTS: Symmetric in size. No palpable masses or lymphadenopathy, skin changes, or nipple drainage. ABDOMEN: Soft, nontender, nondistended.  PELVIC: Normal external female genitalia. Vagina is pink and rugated.  Normal discharge. Normal appearing cervix. Uterus is normal in size. No adnexal mass or tenderness. EXTREMITIES: No cyanosis, clubbing, or edema, 2+ distal pulses.    Assessment:    Healthy female exam.      Plan:    pap smear collected Screening mammogram ordered STD screen performed per patient request Patient advised to perform a monthly breast/vulva exam Patient will be contacted with any abnormal results RTC in 1 year or prn See After Visit Summary for Counseling Recommendations

## 2017-07-05 LAB — HEPATITIS C ANTIBODY
HEP C AB: NONREACTIVE
SIGNAL TO CUT-OFF: 0.01 (ref <1.00)

## 2017-07-05 LAB — HEPATITIS B SURFACE ANTIGEN: HEP B S AG: NONREACTIVE

## 2017-07-05 LAB — RPR: RPR: NONREACTIVE

## 2017-07-05 LAB — HIV ANTIBODY (ROUTINE TESTING W REFLEX): HIV 1&2 Ab, 4th Generation: NONREACTIVE

## 2017-07-07 LAB — CYTOLOGY - PAP
CHLAMYDIA, DNA PROBE: NEGATIVE
DIAGNOSIS: NEGATIVE
HPV (WINDOPATH): NOT DETECTED
Neisseria Gonorrhea: NEGATIVE
Trichomonas: NEGATIVE

## 2017-11-14 ENCOUNTER — Other Ambulatory Visit (INDEPENDENT_AMBULATORY_CARE_PROVIDER_SITE_OTHER): Payer: BLUE CROSS/BLUE SHIELD

## 2017-11-14 DIAGNOSIS — Z202 Contact with and (suspected) exposure to infections with a predominantly sexual mode of transmission: Secondary | ICD-10-CM | POA: Diagnosis not present

## 2017-11-14 DIAGNOSIS — Z113 Encounter for screening for infections with a predominantly sexual mode of transmission: Secondary | ICD-10-CM

## 2017-11-15 LAB — CERVICOVAGINAL ANCILLARY ONLY
Chlamydia: NEGATIVE
Neisseria Gonorrhea: NEGATIVE
Trichomonas: NEGATIVE

## 2017-11-15 LAB — RPR: RPR Ser Ql: NONREACTIVE

## 2017-11-15 LAB — HIV ANTIBODY (ROUTINE TESTING W REFLEX): HIV 1&2 Ab, 4th Generation: NONREACTIVE

## 2017-11-15 LAB — HEPATITIS C ANTIBODY
Hepatitis C Ab: NONREACTIVE
SIGNAL TO CUT-OFF: 0.02 (ref <1.00)

## 2017-11-15 LAB — HEPATITIS B SURFACE ANTIGEN: Hepatitis B Surface Ag: NONREACTIVE

## 2017-11-16 ENCOUNTER — Telehealth: Payer: Self-pay

## 2017-11-16 NOTE — Telephone Encounter (Signed)
Called pt to let her know of negative STI testing. Pt did not answer and I was unable to leave a voicemail

## 2017-11-16 NOTE — Telephone Encounter (Signed)
Spoke with pt and she is aware of negative STI testing

## 2019-01-17 ENCOUNTER — Other Ambulatory Visit: Payer: Self-pay | Admitting: *Deleted

## 2019-01-17 MED ORDER — VALACYCLOVIR HCL 1 G PO TABS: 2000.0000 mg | ORAL_TABLET | Freq: Two times a day (BID) | ORAL | 6 refills | Status: AC | PRN

## 2019-03-05 DIAGNOSIS — S138XXA Sprain of joints and ligaments of other parts of neck, initial encounter: Secondary | ICD-10-CM | POA: Diagnosis not present

## 2019-06-29 ENCOUNTER — Other Ambulatory Visit: Payer: Self-pay

## 2019-06-29 ENCOUNTER — Other Ambulatory Visit: Payer: Self-pay | Admitting: Obstetrics & Gynecology

## 2019-06-29 ENCOUNTER — Ambulatory Visit (INDEPENDENT_AMBULATORY_CARE_PROVIDER_SITE_OTHER): Payer: BC Managed Care – PPO | Admitting: Obstetrics and Gynecology

## 2019-06-29 ENCOUNTER — Encounter: Payer: Self-pay | Admitting: Obstetrics and Gynecology

## 2019-06-29 VITALS — BP 141/84 | HR 80 | Ht 66.0 in | Wt 203.0 lb

## 2019-06-29 DIAGNOSIS — Z23 Encounter for immunization: Secondary | ICD-10-CM | POA: Diagnosis not present

## 2019-06-29 DIAGNOSIS — K649 Unspecified hemorrhoids: Secondary | ICD-10-CM

## 2019-06-29 DIAGNOSIS — Z01419 Encounter for gynecological examination (general) (routine) without abnormal findings: Secondary | ICD-10-CM

## 2019-06-29 DIAGNOSIS — Z0000 Encounter for general adult medical examination without abnormal findings: Secondary | ICD-10-CM

## 2019-06-29 DIAGNOSIS — Z113 Encounter for screening for infections with a predominantly sexual mode of transmission: Secondary | ICD-10-CM | POA: Diagnosis not present

## 2019-06-29 MED ORDER — FLUCONAZOLE 150 MG PO TABS: ORAL_TABLET | ORAL | 1 refills | Status: AC

## 2019-06-29 MED ORDER — NITROGLYCERIN 0.4 % RE OINT: 1.0000 "application " | TOPICAL_OINTMENT | Freq: Three times a day (TID) | RECTAL | 0 refills | Status: AC

## 2019-06-29 NOTE — Therapy (Signed)
I was contacted by after hours call a nurse service stating this patient was seen in the [place] office todaya dn was to be prescribed nitroglycerin which I assume was for hemorrhoids, confirmed by patient and diflucan for yeast.  Since it is Friday I sent those 2 scripts in for the patient I could not find any direct confirmation in Epic  Meds ordered this encounter  Medications  . Nitroglycerin 0.4 % OINT    Sig: Place 1 application rectally 3 (three) times daily.    Dispense:  30 g    Refill:  0  . fluconazole (DIFLUCAN) 150 MG tablet    Sig: Take 1 tablet today and repeat in 3 days    Dispense:  2 tablet    Refill:  [place], MD 06/29/2019 6:55 PM

## 2019-06-29 NOTE — Progress Notes (Signed)
l °

## 2019-06-30 DIAGNOSIS — Z113 Encounter for screening for infections with a predominantly sexual mode of transmission: Secondary | ICD-10-CM | POA: Insufficient documentation

## 2019-06-30 DIAGNOSIS — K649 Unspecified hemorrhoids: Secondary | ICD-10-CM | POA: Insufficient documentation

## 2019-06-30 NOTE — Progress Notes (Signed)
GYNECOLOGY ANNUAL PREVENTATIVE CARE ENCOUNTER NOTE  History:     Alexa Murphy is a 53 y.o. [phone] female here for a routine annual gynecologic exam.  Current complaints: vaginal irritation and hemorrhoids.   Denies abnormal vaginal bleeding, pelvic pain, problems with intercourse or other gynecologic concerns.    Gynecologic History No LMP recorded. Patient has had an ablation. Contraception: none Last Pap: 2018. Results were: normal with negative HPV Last mammogram: 2017. Results were: normal  Obstetric History OB History  Gravida Para Term Preterm AB Living  4 3     1 2   SAB TAB Ectopic Multiple Live Births  1            # Outcome Date GA Lbr Len/2nd Weight Sex Delivery Anes PTL Lv  4 SAB           3 Para      CS-Unspec     2 Para      Vag-Spont     1 Para      Vag-Spont       Past Medical History:  Diagnosis Date  . Allergy   . Anemia   . Diabetes mellitus without complication ([place])    "pre-diabetic" seen by Dr Rosario Jacks every 4mos. [place]  . Hypothyroidism   . Low HDL (under 40)   . Obesity (BMI 30.0-34.9)   . Thyroid disease     Past Surgical History:  Procedure Laterality Date  . CESAREAN SECTION    . CHOLECYSTECTOMY    . DILITATION & CURRETTAGE/HYSTROSCOPY WITH NOVASURE ABLATION N/A 03/27/2014   Procedure: DILATATION & CURETTAGE WITH NOVASURE ABLATION;  Surgeon: Emily Filbert, MD;  Location: [facility];  Service: Gynecology;  Laterality: N/A;  . right shoulder     . TONSILLECTOMY    . TUBAL LIGATION      Current Outpatient Medications on File Prior to Visit  Medication Sig Dispense Refill  . valACYclovir (VALTREX) 1000 MG tablet Take 2 tablets (2,000 mg total) by mouth 2 (two) times daily as needed (takes 2 tabs twice daily for 2 days for fever blisters). 30 tablet 6  . fluconazole (DIFLUCAN) 150 MG tablet Take 1 tablet now and may repeat in 3 days if needed. (Patient not taking: Reported on 07/04/2017) 2 tablet 0  . Homeopathic Products (HYLAFEM) SUPP  Place 1 applicator vaginally daily. (Patient not taking: Reported on 07/04/2017) 12 suppository 3  . metroNIDAZOLE (FLAGYL) 500 MG tablet Take 1 tablet (500 mg total) by mouth 2 (two) times daily. (Patient not taking: Reported on 07/04/2017) 14 tablet 0  . PAZEO 0.7 % SOLN   0  . SYNTHROID 88 MCG tablet Take 88 mcg by mouth every morning.  0   No current facility-administered medications on file prior to visit.     Allergies  Allergen Reactions  . Latex Rash  . Nsaids Rash    Social History:  reports that she has quit smoking. She has never used smokeless tobacco. She reports current alcohol use. She reports that she does not use drugs.  Family History  Problem Relation Age of Onset  . Alcohol abuse Mother   . Cancer Mother        uterine  . Coronary artery disease Father   . Hyperthyroidism Maternal Grandmother   . Diabetes Maternal Grandfather     The following portions of the patient's history were reviewed and updated as appropriate: allergies, current medications, past family history, past medical history, past social history, past surgical  history and problem list.  Review of Systems Pertinent items noted in HPI and remainder of comprehensive ROS otherwise negative.  Physical Exam:  BP (!) 141/84   Pulse 80   Ht 5\' 6"  (1.676 m)   Wt 203 lb (92.1 kg)   BMI 32.77 kg/m  CONSTITUTIONAL: Well-developed, well-nourished female in no acute distress.  HENT:  Normocephalic, atraumatic, External right and left ear normal. Oropharynx is clear and moist EYES: Conjunctivae and EOM are normal. Pupils are equal, round, and reactive to light. No scleral icterus.  NECK: Normal range of motion, supple, no masses.  Normal thyroid.  SKIN: Skin is warm and dry. No rash noted. Not diaphoretic. No erythema. No pallor. MUSCULOSKELETAL: Normal range of motion. No tenderness.  No cyanosis, clubbing, or edema.  2+ distal pulses. NEUROLOGIC: Alert and oriented to person, place, and time. Normal  reflexes, muscle tone coordination. No cranial nerve deficit noted. PSYCHIATRIC: Normal mood and affect. Normal behavior. Normal judgment and thought content. CARDIOVASCULAR: Normal heart rate noted, regular rhythm RESPIRATORY: Clear to auscultation bilaterally. Effort and breath sounds normal, no problems with respiration noted. BREASTS: Symmetric in size. No masses, skin changes, nipple drainage, or lymphadenopathy. ABDOMEN: Soft, normal bowel sounds, no distention noted.  No tenderness, rebound or guarding.  PELVIC: Normal appearing external genitalia with erythema noted; normal appearing vaginal mucosa and cervix. + thick white vaginal discharge.Normal uterine size, no other palpable masses, no uterine or adnexal tenderness. Swabs collected without speculum. One non-thrombosed hemorrhoid.    Assessment and Plan:   1. Routine screening for STI (sexually transmitted infection)  - HIV antibody (with reflex) - RPR - Hepatitis B Surface AntiGEN - Hepatitis C Antibody - Cervicovaginal ancillary only( [place]) - MM Digital Screening; Future - Rx: Diflucan   2. Annual physical exam  - MM Digital Screening; Future  3. Acute hemorrhoid  Rx: Nitro external cream   Will follow up results of pap smear and manage accordingly. Mammogram scheduled Routine preventative health maintenance measures emphasized. Please refer to After Visit Summary for other counseling recommendations.    , Artist Pais, [place] for [employer], [address]

## 2019-07-02 LAB — HEPATITIS C ANTIBODY
Hepatitis C Ab: NONREACTIVE
SIGNAL TO CUT-OFF: 0.01 (ref <1.00)

## 2019-07-02 LAB — RPR: RPR Ser Ql: NONREACTIVE

## 2019-07-02 LAB — HEPATITIS B SURFACE ANTIGEN: Hepatitis B Surface Ag: NONREACTIVE

## 2019-07-02 LAB — HIV ANTIBODY (ROUTINE TESTING W REFLEX): HIV 1&2 Ab, 4th Generation: NONREACTIVE

## 2019-07-03 LAB — CERVICOVAGINAL ANCILLARY ONLY
Chlamydia: NEGATIVE
Comment: NEGATIVE
Comment: NEGATIVE
Comment: NORMAL
Neisseria Gonorrhea: NEGATIVE
Trichomonas: NEGATIVE

## 2019-08-02 ENCOUNTER — Ambulatory Visit (INDEPENDENT_AMBULATORY_CARE_PROVIDER_SITE_OTHER): Payer: BC Managed Care – PPO

## 2019-08-02 ENCOUNTER — Other Ambulatory Visit: Payer: Self-pay

## 2019-08-02 DIAGNOSIS — Z113 Encounter for screening for infections with a predominantly sexual mode of transmission: Secondary | ICD-10-CM

## 2019-08-02 DIAGNOSIS — Z0000 Encounter for general adult medical examination without abnormal findings: Secondary | ICD-10-CM | POA: Diagnosis not present

## 2019-08-02 DIAGNOSIS — Z1231 Encounter for screening mammogram for malignant neoplasm of breast: Secondary | ICD-10-CM | POA: Diagnosis not present

## 2019-08-15 DIAGNOSIS — J019 Acute sinusitis, unspecified: Secondary | ICD-10-CM | POA: Diagnosis not present

## 2020-03-12 DIAGNOSIS — E559 Vitamin D deficiency, unspecified: Secondary | ICD-10-CM | POA: Diagnosis not present

## 2020-03-12 DIAGNOSIS — D539 Nutritional anemia, unspecified: Secondary | ICD-10-CM | POA: Diagnosis not present

## 2020-03-12 DIAGNOSIS — Z13220 Encounter for screening for lipoid disorders: Secondary | ICD-10-CM | POA: Diagnosis not present

## 2020-03-12 DIAGNOSIS — Z114 Encounter for screening for human immunodeficiency virus [HIV]: Secondary | ICD-10-CM | POA: Diagnosis not present

## 2020-03-12 DIAGNOSIS — R0602 Shortness of breath: Secondary | ICD-10-CM | POA: Diagnosis not present

## 2020-03-12 DIAGNOSIS — Z7251 High risk heterosexual behavior: Secondary | ICD-10-CM | POA: Diagnosis not present

## 2020-03-12 DIAGNOSIS — Z0000 Encounter for general adult medical examination without abnormal findings: Secondary | ICD-10-CM | POA: Diagnosis not present

## 2020-03-12 DIAGNOSIS — Z202 Contact with and (suspected) exposure to infections with a predominantly sexual mode of transmission: Secondary | ICD-10-CM | POA: Diagnosis not present

## 2020-03-12 DIAGNOSIS — Z136 Encounter for screening for cardiovascular disorders: Secondary | ICD-10-CM | POA: Diagnosis not present

## 2020-03-12 DIAGNOSIS — Z20822 Contact with and (suspected) exposure to covid-19: Secondary | ICD-10-CM | POA: Diagnosis not present

## 2020-03-12 DIAGNOSIS — R1084 Generalized abdominal pain: Secondary | ICD-10-CM | POA: Diagnosis not present

## 2020-03-12 DIAGNOSIS — R5383 Other fatigue: Secondary | ICD-10-CM | POA: Diagnosis not present

## 2020-03-12 DIAGNOSIS — N898 Other specified noninflammatory disorders of vagina: Secondary | ICD-10-CM | POA: Diagnosis not present

## 2020-03-12 DIAGNOSIS — Z131 Encounter for screening for diabetes mellitus: Secondary | ICD-10-CM | POA: Diagnosis not present

## 2020-03-26 DIAGNOSIS — Z202 Contact with and (suspected) exposure to infections with a predominantly sexual mode of transmission: Secondary | ICD-10-CM | POA: Diagnosis not present

## 2020-03-26 DIAGNOSIS — N898 Other specified noninflammatory disorders of vagina: Secondary | ICD-10-CM | POA: Diagnosis not present

## 2020-03-26 DIAGNOSIS — E7800 Pure hypercholesterolemia, unspecified: Secondary | ICD-10-CM | POA: Diagnosis not present

## 2020-03-26 DIAGNOSIS — Z1159 Encounter for screening for other viral diseases: Secondary | ICD-10-CM | POA: Diagnosis not present

## 2020-04-18 DIAGNOSIS — R1084 Generalized abdominal pain: Secondary | ICD-10-CM | POA: Diagnosis not present

## 2020-04-25 DIAGNOSIS — N760 Acute vaginitis: Secondary | ICD-10-CM | POA: Diagnosis not present

## 2020-04-25 DIAGNOSIS — R1084 Generalized abdominal pain: Secondary | ICD-10-CM | POA: Diagnosis not present

## 2020-04-25 DIAGNOSIS — R894 Abnormal immunological findings in specimens from other organs, systems and tissues: Secondary | ICD-10-CM | POA: Diagnosis not present

## 2020-04-25 DIAGNOSIS — E7800 Pure hypercholesterolemia, unspecified: Secondary | ICD-10-CM | POA: Diagnosis not present

## 2020-05-16 DIAGNOSIS — E7800 Pure hypercholesterolemia, unspecified: Secondary | ICD-10-CM | POA: Diagnosis not present

## 2020-05-16 DIAGNOSIS — Z1159 Encounter for screening for other viral diseases: Secondary | ICD-10-CM | POA: Diagnosis not present

## 2020-05-16 DIAGNOSIS — R894 Abnormal immunological findings in specimens from other organs, systems and tissues: Secondary | ICD-10-CM | POA: Diagnosis not present

## 2020-05-16 DIAGNOSIS — R1084 Generalized abdominal pain: Secondary | ICD-10-CM | POA: Diagnosis not present

## 2020-06-10 DIAGNOSIS — E559 Vitamin D deficiency, unspecified: Secondary | ICD-10-CM | POA: Diagnosis not present

## 2020-06-10 DIAGNOSIS — R894 Abnormal immunological findings in specimens from other organs, systems and tissues: Secondary | ICD-10-CM | POA: Diagnosis not present

## 2020-06-10 DIAGNOSIS — E7800 Pure hypercholesterolemia, unspecified: Secondary | ICD-10-CM | POA: Diagnosis not present

## 2020-06-17 DIAGNOSIS — Z1211 Encounter for screening for malignant neoplasm of colon: Secondary | ICD-10-CM | POA: Diagnosis not present

## 2020-06-17 DIAGNOSIS — K645 Perianal venous thrombosis: Secondary | ICD-10-CM | POA: Diagnosis not present

## 2020-07-10 DIAGNOSIS — E7800 Pure hypercholesterolemia, unspecified: Secondary | ICD-10-CM | POA: Diagnosis not present

## 2020-07-10 DIAGNOSIS — R894 Abnormal immunological findings in specimens from other organs, systems and tissues: Secondary | ICD-10-CM | POA: Diagnosis not present

## 2020-07-10 DIAGNOSIS — Z6833 Body mass index (BMI) 33.0-33.9, adult: Secondary | ICD-10-CM | POA: Diagnosis not present

## 2020-07-10 DIAGNOSIS — Z20822 Contact with and (suspected) exposure to covid-19: Secondary | ICD-10-CM | POA: Diagnosis not present

## 2020-07-10 DIAGNOSIS — E559 Vitamin D deficiency, unspecified: Secondary | ICD-10-CM | POA: Diagnosis not present

## 2020-07-15 DIAGNOSIS — Z1211 Encounter for screening for malignant neoplasm of colon: Secondary | ICD-10-CM | POA: Diagnosis not present

## 2020-07-15 DIAGNOSIS — K635 Polyp of colon: Secondary | ICD-10-CM | POA: Diagnosis not present

## 2020-07-15 DIAGNOSIS — K6389 Other specified diseases of intestine: Secondary | ICD-10-CM | POA: Diagnosis not present

## 2020-07-15 DIAGNOSIS — K5730 Diverticulosis of large intestine without perforation or abscess without bleeding: Secondary | ICD-10-CM | POA: Diagnosis not present

## 2020-07-29 DIAGNOSIS — B370 Candidal stomatitis: Secondary | ICD-10-CM | POA: Diagnosis not present

## 2020-07-29 DIAGNOSIS — E7800 Pure hypercholesterolemia, unspecified: Secondary | ICD-10-CM | POA: Diagnosis not present

## 2020-07-29 DIAGNOSIS — N1831 Chronic kidney disease, stage 3a: Secondary | ICD-10-CM | POA: Diagnosis not present

## 2020-07-29 DIAGNOSIS — S34109S Unspecified injury to unspecified level of lumbar spinal cord, sequela: Secondary | ICD-10-CM | POA: Diagnosis not present

## 2020-08-06 DIAGNOSIS — Z1331 Encounter for screening for depression: Secondary | ICD-10-CM | POA: Diagnosis not present

## 2020-08-06 DIAGNOSIS — Z6833 Body mass index (BMI) 33.0-33.9, adult: Secondary | ICD-10-CM | POA: Diagnosis not present

## 2020-08-06 DIAGNOSIS — Z1339 Encounter for screening examination for other mental health and behavioral disorders: Secondary | ICD-10-CM | POA: Diagnosis not present

## 2020-08-06 DIAGNOSIS — E7800 Pure hypercholesterolemia, unspecified: Secondary | ICD-10-CM | POA: Diagnosis not present

## 2020-08-06 DIAGNOSIS — R894 Abnormal immunological findings in specimens from other organs, systems and tissues: Secondary | ICD-10-CM | POA: Diagnosis not present

## 2020-08-06 DIAGNOSIS — E559 Vitamin D deficiency, unspecified: Secondary | ICD-10-CM | POA: Diagnosis not present

## 2020-08-21 DIAGNOSIS — Z780 Asymptomatic menopausal state: Secondary | ICD-10-CM | POA: Diagnosis not present

## 2020-09-08 DIAGNOSIS — Z6833 Body mass index (BMI) 33.0-33.9, adult: Secondary | ICD-10-CM | POA: Diagnosis not present

## 2020-09-08 DIAGNOSIS — E7800 Pure hypercholesterolemia, unspecified: Secondary | ICD-10-CM | POA: Diagnosis not present

## 2020-09-08 DIAGNOSIS — R894 Abnormal immunological findings in specimens from other organs, systems and tissues: Secondary | ICD-10-CM | POA: Diagnosis not present

## 2020-09-08 DIAGNOSIS — E559 Vitamin D deficiency, unspecified: Secondary | ICD-10-CM | POA: Diagnosis not present

## 2021-05-06 ENCOUNTER — Other Ambulatory Visit (HOSPITAL_COMMUNITY)
Admission: RE | Admit: 2021-05-06 | Discharge: 2021-05-06 | Disposition: A | Discharge status: Home / Self Care | Payer: BC Managed Care – PPO | Source: Ambulatory Visit | Attending: Obstetrics and Gynecology | Admitting: Obstetrics and Gynecology

## 2021-05-06 ENCOUNTER — Other Ambulatory Visit: Payer: Self-pay

## 2021-05-06 ENCOUNTER — Encounter: Payer: Self-pay | Admitting: Obstetrics and Gynecology

## 2021-05-06 ENCOUNTER — Ambulatory Visit (INDEPENDENT_AMBULATORY_CARE_PROVIDER_SITE_OTHER): Payer: BC Managed Care – PPO | Admitting: Obstetrics and Gynecology

## 2021-05-06 VITALS — BP 117/81 | HR 71 | Ht 66.0 in | Wt 210.0 lb

## 2021-05-06 DIAGNOSIS — Z113 Encounter for screening for infections with a predominantly sexual mode of transmission: Secondary | ICD-10-CM | POA: Diagnosis not present

## 2021-05-06 DIAGNOSIS — Z1151 Encounter for screening for human papillomavirus (HPV): Secondary | ICD-10-CM | POA: Insufficient documentation

## 2021-05-06 DIAGNOSIS — Z01419 Encounter for gynecological examination (general) (routine) without abnormal findings: Secondary | ICD-10-CM | POA: Insufficient documentation

## 2021-05-06 NOTE — Progress Notes (Signed)
GYNECOLOGY ANNUAL PREVENTATIVE CARE ENCOUNTER NOTE  History:     Alexa Murphy is a 55 y.o. [phone] female here for a routine annual gynecologic exam.  Current complaints: None.   Denies abnormal vaginal bleeding, discharge, pelvic pain, problems with intercourse or other gynecologic concerns.    Strong family history of uterine CA. Both mother and grandmother. Mother had total hysterectomy. She is taking estroven.   Gynecologic History No LMP recorded. Patient has had an ablation. Contraception: ablasion  Last Pap: 2015. Result was normal with negative HPV Last Mammogram: 2020  Result was normal Last Colonoscopy: December 2021.   Result was normal  Obstetric History OB History  Gravida Para Term Preterm AB Living  '4 3     1 2  '$ SAB IAB Ectopic Multiple Live Births  1            # Outcome Date GA Lbr Len/2nd Weight Sex Delivery Anes PTL Lv  4 SAB           3 Para      CS-Unspec     2 Para      Vag-Spont     1 Para      Vag-Spont       Past Medical History:  Diagnosis Date   Allergy    Anemia    Diabetes mellitus without complication ([place])    "pre-diabetic" seen by Alexa Murphy every 9mo. [place]   Hypertension    Hypothyroidism    Low HDL (under 40)    Obesity (BMI 30.0-34.9)    Thyroid disease     Past Surgical History:  Procedure Laterality Date   CESAREAN SECTION     CHOLECYSTECTOMY     DILITATION & CURRETTAGE/HYSTROSCOPY WITH NOVASURE ABLATION N/A 03/27/2014   Procedure: DILATATION & CURETTAGE WITH NOVASURE ABLATION;  Surgeon: Alexa Filbert MD;  Location: [facility];  Service: Gynecology;  Laterality: N/A;   right shoulder      TONSILLECTOMY     TUBAL LIGATION      Current Outpatient Medications on File Prior to Visit  Medication Sig Dispense Refill   amlodipine-atorvastatin (CADUET) 10-10 MG tablet Take 1 tablet by mouth daily.     valACYclovir (VALTREX) 1000 MG tablet Take 2 tablets (2,000 mg total) by mouth 2 (two) times daily as needed (takes 2 tabs  twice daily for 2 days for fever blisters). 30 tablet 6   fluconazole (DIFLUCAN) 150 MG tablet Take 1 tablet now and may repeat in 3 days if needed. (Patient not taking: No sig reported) 2 tablet 0   fluconazole (DIFLUCAN) 150 MG tablet Take 1 tablet today and repeat in 3 days (Patient not taking: Reported on 05/06/2021) 2 tablet 1   Homeopathic Products (HYLAFEM) SUPP Place 1 applicator vaginally daily. (Patient not taking: No sig reported) 12 suppository 3   metroNIDAZOLE (FLAGYL) 500 MG tablet Take 1 tablet (500 mg total) by mouth 2 (two) times daily. (Patient not taking: No sig reported) 14 tablet 0   Nitroglycerin 0.4 % OINT Place 1 application rectally 3 (three) times daily. (Patient not taking: Reported on 05/06/2021) 30 g 0   PAZEO 0.7 % SOLN  (Patient not taking: Reported on 05/06/2021)  0   SYNTHROID 88 MCG tablet Take 88 mcg by mouth every morning. (Patient not taking: Reported on 05/06/2021)  0   No current facility-administered medications on file prior to visit.    Allergies  Allergen Reactions   Latex Rash   Nsaids Rash  Social History:  reports that she has quit smoking. She has never used smokeless tobacco. She reports current alcohol use. She reports that she does not use drugs.  Family History  Problem Relation Age of Onset   Alcohol abuse Mother    Cancer Mother        uterine   Coronary artery disease Father    Hyperthyroidism Maternal Grandmother    Diabetes Maternal Grandfather     The following portions of the patient's history were reviewed and updated as appropriate: allergies, current medications, past family history, past medical history, past social history, past surgical history and problem list.  Review of Systems Pertinent items noted in HPI and remainder of comprehensive ROS otherwise negative.  Physical Exam:  BP 117/81   Pulse 71   Ht '5\' 6"'$  (1.676 m)   Wt 210 lb (95.3 kg)   BMI 33.89 kg/m  CONSTITUTIONAL: Well-developed, well-nourished female  in no acute distress.  HENT:  Normocephalic, atraumatic, External right and left ear normal.  EYES: Conjunctivae and EOM are normal. Pupils are equal, round, and reactive to light. No scleral icterus.  NECK: Normal range of motion, supple, no masses.  Normal thyroid.  SKIN: Skin is warm and dry. No rash noted. Not diaphoretic. No erythema. No pallor. MUSCULOSKELETAL: Normal range of motion. No tenderness.  No cyanosis, clubbing, or edema. NEUROLOGIC: Alert and oriented to person, place, and time. Normal reflexes, muscle tone coordination.  PSYCHIATRIC: Normal mood and affect. Normal behavior. Normal judgment and thought content. CARDIOVASCULAR: Normal heart rate noted, regular rhythm RESPIRATORY: Clear to auscultation bilaterally. Effort and breath sounds normal, no problems with respiration noted. BREASTS: Symmetric in size. No masses, tenderness, skin changes, nipple drainage, or lymphadenopathy bilaterally. Performed in the presence of a chaperone. ABDOMEN: Soft, no distention noted.  No tenderness, rebound or guarding.  PELVIC: Normal appearing external genitalia and urethral meatus; normal appearing vaginal mucosa and cervix.  No abnormal vaginal discharge noted.  Pap smear obtained.  Normal uterine size, no other palpable masses, no uterine or adnexal tenderness.  Performed in the presence of a chaperone.   Assessment and Plan:   1. Women's annual routine gynecological examination  - Cytology - PAP( [place]) - Hepatitis C Antibody - RPR - HIV antibody (with reflex) - Hepatitis B Surface AntiGEN - MM Digital Screening; Future    Will follow up results of pap smear and manage accordingly. Mammogram scheduled Routine preventative health maintenance measures emphasized. Please refer to After Visit Summary for other counseling recommendations.    Alexa Murphy, Alexa Murphy, Alexa Murphy for [employer], [place]

## 2021-05-07 LAB — HIV ANTIBODY (ROUTINE TESTING W REFLEX): HIV 1&2 Ab, 4th Generation: NONREACTIVE

## 2021-05-08 LAB — CYTOLOGY - PAP
Chlamydia: NEGATIVE
Comment: NEGATIVE
Comment: NEGATIVE
Comment: NEGATIVE
Comment: NORMAL
Diagnosis: NEGATIVE
High risk HPV: NEGATIVE
Neisseria Gonorrhea: NEGATIVE
Trichomonas: NEGATIVE

## 2021-05-14 ENCOUNTER — Other Ambulatory Visit: Payer: Self-pay

## 2021-05-14 ENCOUNTER — Ambulatory Visit (INDEPENDENT_AMBULATORY_CARE_PROVIDER_SITE_OTHER): Payer: BC Managed Care – PPO

## 2021-05-14 DIAGNOSIS — Z1231 Encounter for screening mammogram for malignant neoplasm of breast: Secondary | ICD-10-CM | POA: Diagnosis not present

## 2021-05-14 DIAGNOSIS — Z01419 Encounter for gynecological examination (general) (routine) without abnormal findings: Secondary | ICD-10-CM

## 2022-04-20 IMAGING — MG MM DIGITAL SCREENING BILAT W/ TOMO AND CAD
8 series · 8 of 24 positions shown · non-contrast
Comparison: Previous exam(s).

CLINICAL DATA: Screening.

EXAM:
DIGITAL SCREENING BILATERAL MAMMOGRAM WITH TOMOSYNTHESIS AND CAD
TECHNIQUE: Bilateral screening digital craniocaudal and mediolateral oblique
mammograms were obtained. Bilateral screening digital breast
tomosynthesis was performed. The images were evaluated with
computer-aided detection.

[R CC synth-2D]
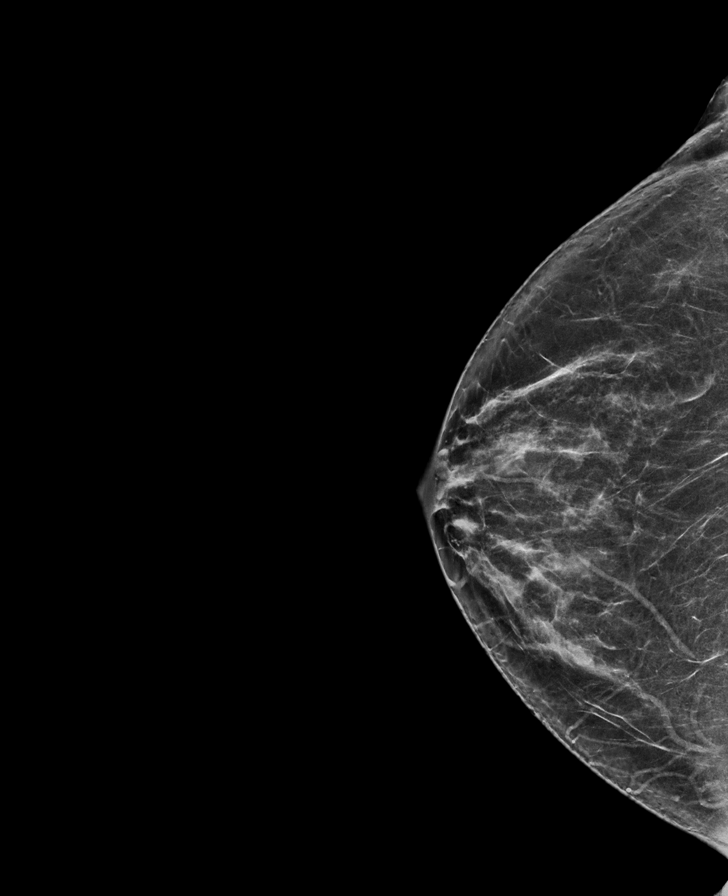

[R MLO synth-2D]
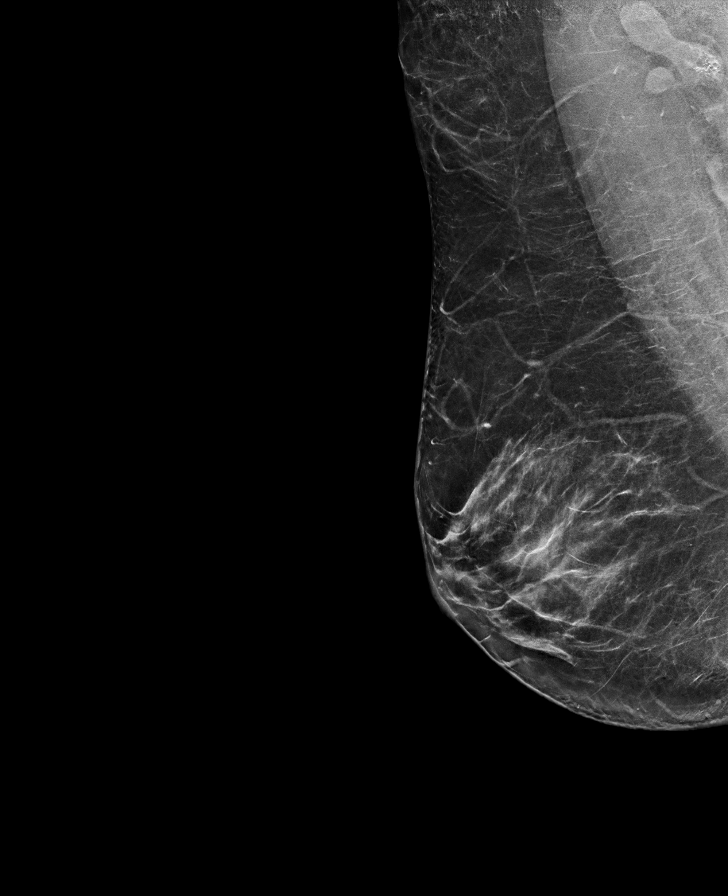

[L MLO synth-2D]
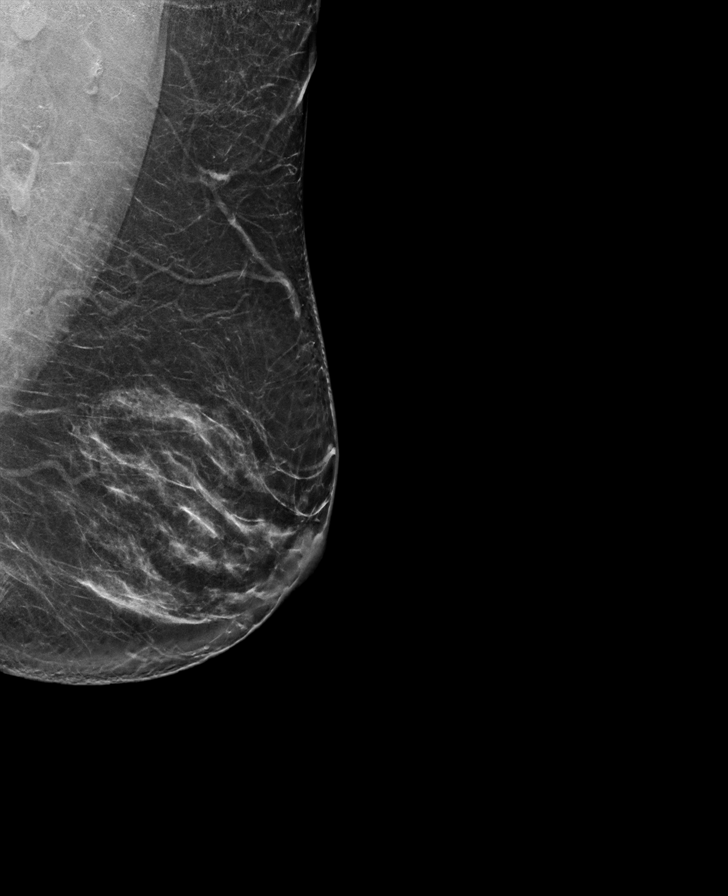

[L CC synth-2D]
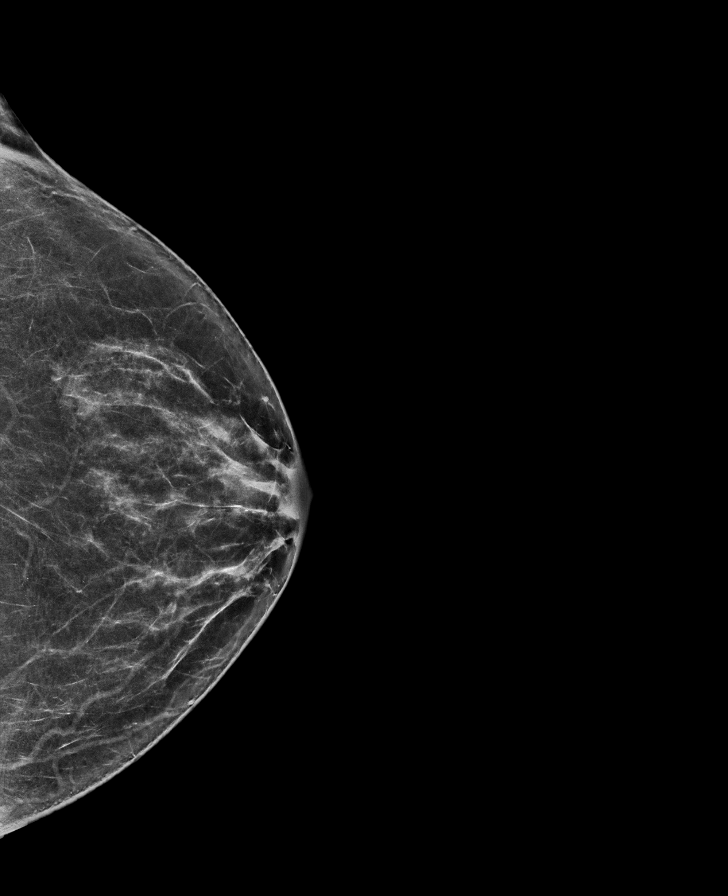

[L MLO tomo · tomo slice 39/76.0]
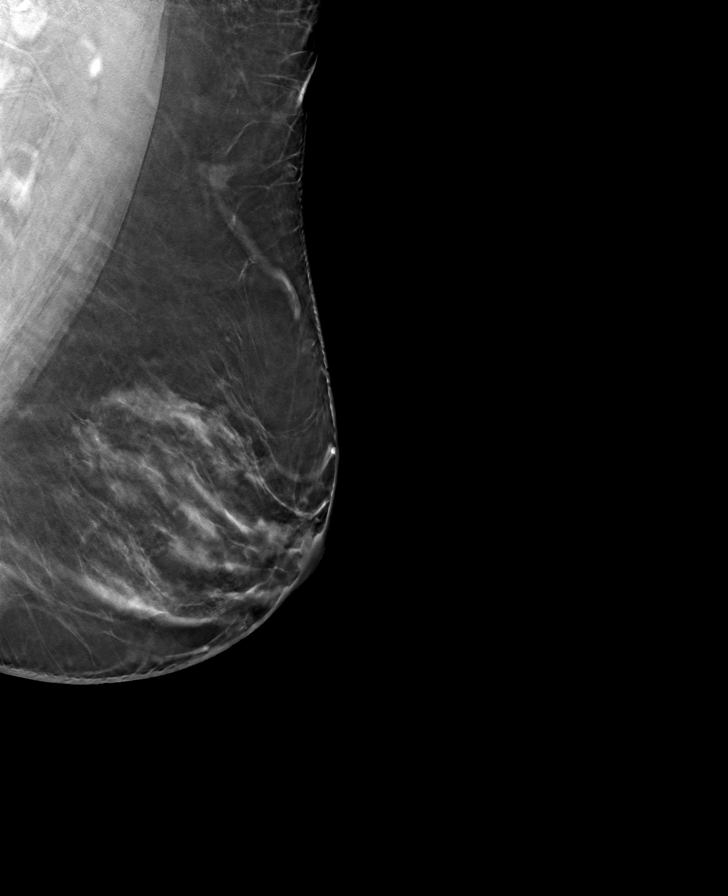

[L CC tomo · tomo slice 35/68.0]
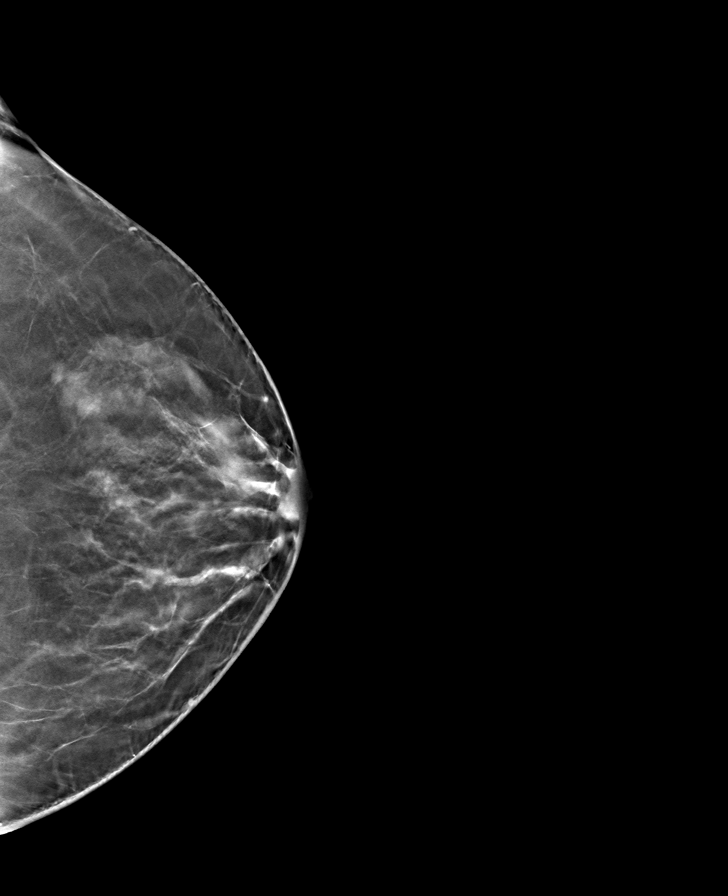

[R CC tomo · tomo slice 34/67.0]
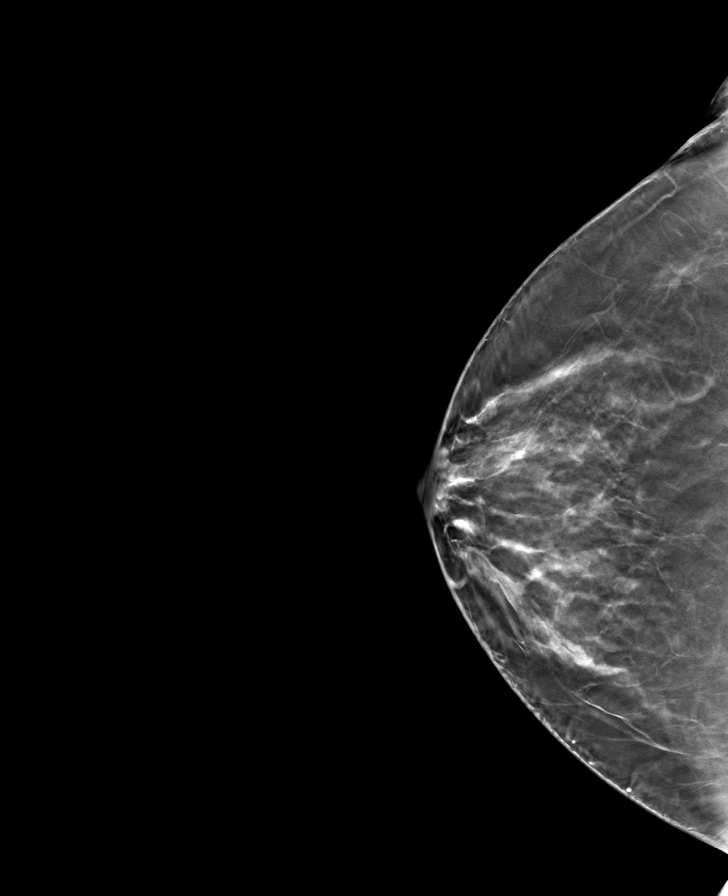

[R MLO tomo · tomo slice 39/76.0]
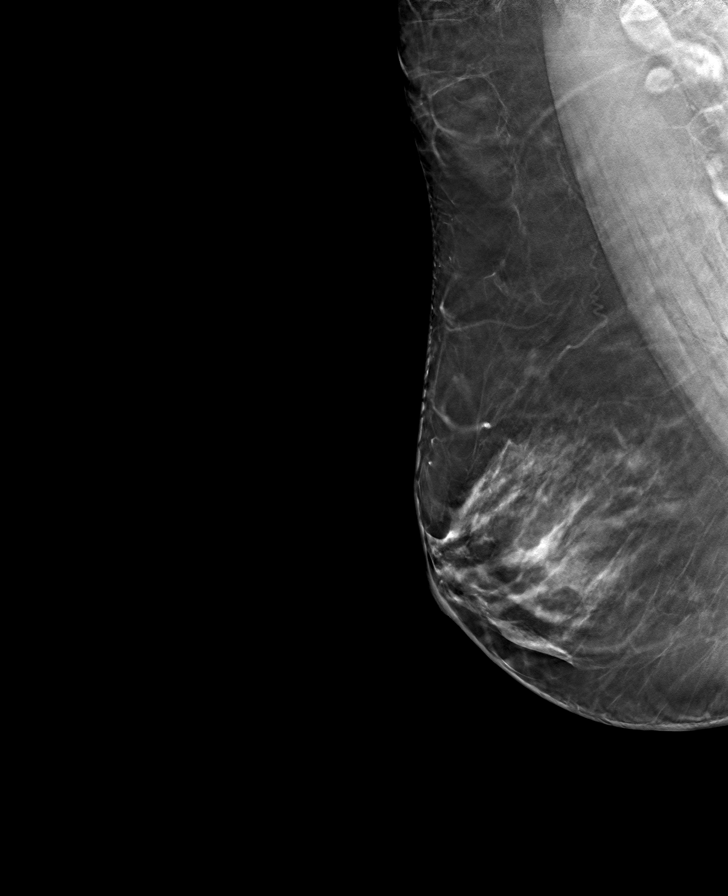

[8 of 24 positions shown; findings below may reference images not displayed]

ACR Breast Density Category b: There are scattered areas of
fibroglandular density.
FINDINGS: There are no findings suspicious for malignancy.
IMPRESSION: No mammographic evidence of malignancy. A result letter of this
screening mammogram will be mailed directly to the patient.

RECOMMENDATION:
Screening mammogram in one year. (Code:51-O-LD2)

BI-RADS CATEGORY  1: Negative.

## 2023-01-09 ENCOUNTER — Encounter (INDEPENDENT_AMBULATORY_CARE_PROVIDER_SITE_OTHER): Payer: Self-pay

## 2023-08-19 ENCOUNTER — Ambulatory Visit: Payer: BLUE CROSS/BLUE SHIELD | Admitting: Obstetrics and Gynecology
